# Patient Record
Sex: Male | Born: 1935 | Race: White | Hispanic: No | Smoking: Former smoker
Health system: Southern US, Community
[De-identification: ages and names within clinical notes are randomized; demographics above are authoritative.]

## PROBLEM LIST (undated history)

## (undated) DIAGNOSIS — E119 Type 2 diabetes mellitus without complications: Secondary | ICD-10-CM

## (undated) DIAGNOSIS — E785 Hyperlipidemia, unspecified: Secondary | ICD-10-CM

## (undated) DIAGNOSIS — J449 Chronic obstructive pulmonary disease, unspecified: Secondary | ICD-10-CM

## (undated) DIAGNOSIS — I10 Essential (primary) hypertension: Secondary | ICD-10-CM

## (undated) DIAGNOSIS — I2510 Atherosclerotic heart disease of native coronary artery without angina pectoris: Secondary | ICD-10-CM

## (undated) HISTORY — PX: JOINT REPLACEMENT: SHX530

## (undated) HISTORY — PX: HERNIA REPAIR: SHX51

---

## 2015-04-23 DIAGNOSIS — N182 Chronic kidney disease, stage 2 (mild): Secondary | ICD-10-CM | POA: Diagnosis present

## 2015-04-23 DIAGNOSIS — I10 Essential (primary) hypertension: Secondary | ICD-10-CM | POA: Diagnosis present

## 2015-04-23 DIAGNOSIS — E119 Type 2 diabetes mellitus without complications: Secondary | ICD-10-CM

## 2015-04-23 DIAGNOSIS — I2510 Atherosclerotic heart disease of native coronary artery without angina pectoris: Secondary | ICD-10-CM | POA: Diagnosis present

## 2015-04-25 DIAGNOSIS — F0280 Dementia in other diseases classified elsewhere without behavioral disturbance: Secondary | ICD-10-CM | POA: Diagnosis present

## 2017-11-05 ENCOUNTER — Encounter (HOSPITAL_COMMUNITY): Payer: Self-pay | Admitting: Emergency Medicine

## 2017-11-05 ENCOUNTER — Emergency Department (HOSPITAL_COMMUNITY): Payer: Medicare Other

## 2017-11-05 ENCOUNTER — Other Ambulatory Visit: Payer: Self-pay

## 2017-11-05 ENCOUNTER — Inpatient Hospital Stay (HOSPITAL_COMMUNITY): Payer: Medicare Other

## 2017-11-05 DIAGNOSIS — N400 Enlarged prostate without lower urinary tract symptoms: Secondary | ICD-10-CM

## 2017-11-05 DIAGNOSIS — E119 Type 2 diabetes mellitus without complications: Secondary | ICD-10-CM

## 2017-11-05 DIAGNOSIS — R0602 Shortness of breath: Secondary | ICD-10-CM

## 2017-11-05 DIAGNOSIS — F0280 Dementia in other diseases classified elsewhere without behavioral disturbance: Secondary | ICD-10-CM | POA: Diagnosis present

## 2017-11-05 DIAGNOSIS — I340 Nonrheumatic mitral (valve) insufficiency: Secondary | ICD-10-CM | POA: Diagnosis not present

## 2017-11-05 DIAGNOSIS — E1142 Type 2 diabetes mellitus with diabetic polyneuropathy: Secondary | ICD-10-CM | POA: Diagnosis present

## 2017-11-05 DIAGNOSIS — I272 Pulmonary hypertension, unspecified: Secondary | ICD-10-CM | POA: Diagnosis not present

## 2017-11-05 DIAGNOSIS — F419 Anxiety disorder, unspecified: Secondary | ICD-10-CM | POA: Diagnosis present

## 2017-11-05 DIAGNOSIS — N189 Chronic kidney disease, unspecified: Secondary | ICD-10-CM

## 2017-11-05 DIAGNOSIS — Z794 Long term (current) use of insulin: Secondary | ICD-10-CM

## 2017-11-05 DIAGNOSIS — I214 Non-ST elevation (NSTEMI) myocardial infarction: Secondary | ICD-10-CM | POA: Diagnosis present

## 2017-11-05 DIAGNOSIS — Z66 Do not resuscitate: Secondary | ICD-10-CM

## 2017-11-05 DIAGNOSIS — K219 Gastro-esophageal reflux disease without esophagitis: Secondary | ICD-10-CM | POA: Diagnosis present

## 2017-11-05 DIAGNOSIS — R0682 Tachypnea, not elsewhere classified: Secondary | ICD-10-CM | POA: Diagnosis not present

## 2017-11-05 DIAGNOSIS — Z4659 Encounter for fitting and adjustment of other gastrointestinal appliance and device: Secondary | ICD-10-CM

## 2017-11-05 DIAGNOSIS — K567 Ileus, unspecified: Secondary | ICD-10-CM | POA: Diagnosis not present

## 2017-11-05 DIAGNOSIS — N179 Acute kidney failure, unspecified: Secondary | ICD-10-CM | POA: Diagnosis present

## 2017-11-05 DIAGNOSIS — Z87891 Personal history of nicotine dependence: Secondary | ICD-10-CM | POA: Diagnosis not present

## 2017-11-05 DIAGNOSIS — N182 Chronic kidney disease, stage 2 (mild): Secondary | ICD-10-CM | POA: Diagnosis present

## 2017-11-05 DIAGNOSIS — I5023 Acute on chronic systolic (congestive) heart failure: Secondary | ICD-10-CM

## 2017-11-05 DIAGNOSIS — E1151 Type 2 diabetes mellitus with diabetic peripheral angiopathy without gangrene: Secondary | ICD-10-CM | POA: Diagnosis present

## 2017-11-05 DIAGNOSIS — Z0189 Encounter for other specified special examinations: Secondary | ICD-10-CM

## 2017-11-05 DIAGNOSIS — Z8249 Family history of ischemic heart disease and other diseases of the circulatory system: Secondary | ICD-10-CM

## 2017-11-05 DIAGNOSIS — Z79899 Other long term (current) drug therapy: Secondary | ICD-10-CM

## 2017-11-05 DIAGNOSIS — R0902 Hypoxemia: Secondary | ICD-10-CM

## 2017-11-05 DIAGNOSIS — F329 Major depressive disorder, single episode, unspecified: Secondary | ICD-10-CM | POA: Diagnosis present

## 2017-11-05 DIAGNOSIS — R400 Somnolence: Secondary | ICD-10-CM | POA: Diagnosis not present

## 2017-11-05 DIAGNOSIS — N183 Chronic kidney disease, stage 3 (moderate): Secondary | ICD-10-CM | POA: Diagnosis not present

## 2017-11-05 DIAGNOSIS — E1122 Type 2 diabetes mellitus with diabetic chronic kidney disease: Secondary | ICD-10-CM | POA: Diagnosis present

## 2017-11-05 DIAGNOSIS — I255 Ischemic cardiomyopathy: Secondary | ICD-10-CM | POA: Diagnosis present

## 2017-11-05 DIAGNOSIS — J441 Chronic obstructive pulmonary disease with (acute) exacerbation: Secondary | ICD-10-CM | POA: Diagnosis present

## 2017-11-05 DIAGNOSIS — N184 Chronic kidney disease, stage 4 (severe): Secondary | ICD-10-CM | POA: Diagnosis present

## 2017-11-05 DIAGNOSIS — I4892 Unspecified atrial flutter: Secondary | ICD-10-CM | POA: Diagnosis not present

## 2017-11-05 DIAGNOSIS — E785 Hyperlipidemia, unspecified: Secondary | ICD-10-CM | POA: Diagnosis present

## 2017-11-05 DIAGNOSIS — I130 Hypertensive heart and chronic kidney disease with heart failure and stage 1 through stage 4 chronic kidney disease, or unspecified chronic kidney disease: Secondary | ICD-10-CM | POA: Diagnosis present

## 2017-11-05 DIAGNOSIS — I2510 Atherosclerotic heart disease of native coronary artery without angina pectoris: Secondary | ICD-10-CM | POA: Diagnosis present

## 2017-11-05 DIAGNOSIS — I5043 Acute on chronic combined systolic (congestive) and diastolic (congestive) heart failure: Secondary | ICD-10-CM | POA: Diagnosis present

## 2017-11-05 DIAGNOSIS — J810 Acute pulmonary edema: Secondary | ICD-10-CM | POA: Insufficient documentation

## 2017-11-05 DIAGNOSIS — F1010 Alcohol abuse, uncomplicated: Secondary | ICD-10-CM | POA: Diagnosis present

## 2017-11-05 DIAGNOSIS — Z6831 Body mass index (BMI) 31.0-31.9, adult: Secondary | ICD-10-CM

## 2017-11-05 DIAGNOSIS — J9601 Acute respiratory failure with hypoxia: Secondary | ICD-10-CM | POA: Diagnosis present

## 2017-11-05 DIAGNOSIS — Z515 Encounter for palliative care: Secondary | ICD-10-CM

## 2017-11-05 DIAGNOSIS — R0789 Other chest pain: Secondary | ICD-10-CM | POA: Diagnosis present

## 2017-11-05 DIAGNOSIS — K56609 Unspecified intestinal obstruction, unspecified as to partial versus complete obstruction: Secondary | ICD-10-CM | POA: Diagnosis not present

## 2017-11-05 DIAGNOSIS — I361 Nonrheumatic tricuspid (valve) insufficiency: Secondary | ICD-10-CM | POA: Diagnosis not present

## 2017-11-05 DIAGNOSIS — E669 Obesity, unspecified: Secondary | ICD-10-CM | POA: Diagnosis present

## 2017-11-05 DIAGNOSIS — I420 Dilated cardiomyopathy: Secondary | ICD-10-CM | POA: Diagnosis not present

## 2017-11-05 DIAGNOSIS — I959 Hypotension, unspecified: Secondary | ICD-10-CM | POA: Diagnosis present

## 2017-11-05 DIAGNOSIS — E7849 Other hyperlipidemia: Secondary | ICD-10-CM | POA: Diagnosis not present

## 2017-11-05 DIAGNOSIS — R079 Chest pain, unspecified: Secondary | ICD-10-CM | POA: Diagnosis not present

## 2017-11-05 DIAGNOSIS — I10 Essential (primary) hypertension: Secondary | ICD-10-CM | POA: Diagnosis not present

## 2017-11-05 DIAGNOSIS — F32A Depression, unspecified: Secondary | ICD-10-CM

## 2017-11-05 DIAGNOSIS — F320 Major depressive disorder, single episode, mild: Secondary | ICD-10-CM | POA: Diagnosis not present

## 2017-11-05 HISTORY — DX: Atherosclerotic heart disease of native coronary artery without angina pectoris: I25.10

## 2017-11-05 HISTORY — DX: Hyperlipidemia, unspecified: E78.5

## 2017-11-05 HISTORY — DX: Type 2 diabetes mellitus without complications: E11.9

## 2017-11-05 HISTORY — DX: Essential (primary) hypertension: I10

## 2017-11-05 HISTORY — DX: Chronic obstructive pulmonary disease, unspecified: J44.9

## 2017-11-05 LAB — BASIC METABOLIC PANEL
ANION GAP: 11 (ref 5–15)
BUN: 32 mg/dL — AB (ref 6–20)
CALCIUM: 7.9 mg/dL — AB (ref 8.9–10.3)
CO2: 21 mmol/L — ABNORMAL LOW (ref 22–32)
Chloride: 105 mmol/L (ref 101–111)
Creatinine, Ser: 1.83 mg/dL — ABNORMAL HIGH (ref 0.61–1.24)
GFR calc Af Amer: 39 mL/min — ABNORMAL LOW (ref >60)
GFR, EST NON AFRICAN AMERICAN: 33 mL/min — AB (ref >60)
Glucose, Bld: 291 mg/dL — ABNORMAL HIGH (ref 65–99)
POTASSIUM: 3.8 mmol/L (ref 3.5–5.1)
SODIUM: 137 mmol/L (ref 135–145)

## 2017-11-05 LAB — PROTIME-INR
INR: 0.98
Prothrombin Time: 12.8 seconds (ref 11.4–15.2)

## 2017-11-05 LAB — TSH: TSH: 1.841 u[IU]/mL (ref 0.350–4.500)

## 2017-11-05 LAB — CBG MONITORING, ED
GLUCOSE-CAPILLARY: 188 mg/dL — AB (ref 65–99)
Glucose-Capillary: 234 mg/dL — ABNORMAL HIGH (ref 65–99)

## 2017-11-05 LAB — TROPONIN I
TROPONIN I: 0.39 ng/mL — AB (ref <0.03)
TROPONIN I: 0.69 ng/mL — AB (ref <0.03)
Troponin I: 0.47 ng/mL (ref <0.03)

## 2017-11-05 LAB — I-STAT TROPONIN, ED
TROPONIN I, POC: 0.09 ng/mL — AB (ref 0.00–0.08)
Troponin i, poc: 0.17 ng/mL (ref 0.00–0.08)

## 2017-11-05 LAB — CBC
HEMATOCRIT: 36.4 % — AB (ref 39.0–52.0)
Hemoglobin: 12.2 g/dL — ABNORMAL LOW (ref 13.0–17.0)
MCH: 30.5 pg (ref 26.0–34.0)
MCHC: 33.5 g/dL (ref 30.0–36.0)
MCV: 91 fL (ref 78.0–100.0)
PLATELETS: 144 10*3/uL — AB (ref 150–400)
RBC: 4 MIL/uL — ABNORMAL LOW (ref 4.22–5.81)
RDW: 14.8 % (ref 11.5–15.5)
WBC: 9.4 10*3/uL (ref 4.0–10.5)

## 2017-11-05 LAB — HEPARIN LEVEL (UNFRACTIONATED)
Heparin Unfractionated: <0.1 IU/mL — ABNORMAL LOW (ref 0.30–0.70)
Heparin Unfractionated: <0.1 IU/mL — ABNORMAL LOW (ref 0.30–0.70)

## 2017-11-05 LAB — RAPID URINE DRUG SCREEN, HOSP PERFORMED
AMPHETAMINES: NOT DETECTED
BARBITURATES: NOT DETECTED
BENZODIAZEPINES: NOT DETECTED
COCAINE: NOT DETECTED
Opiates: POSITIVE — AB
TETRAHYDROCANNABINOL: NOT DETECTED

## 2017-11-05 LAB — MRSA PCR SCREENING: MRSA by PCR: NEGATIVE

## 2017-11-05 LAB — GLUCOSE, CAPILLARY
GLUCOSE-CAPILLARY: 159 mg/dL — AB (ref 65–99)
GLUCOSE-CAPILLARY: 168 mg/dL — AB (ref 65–99)

## 2017-11-05 LAB — HEMOGLOBIN A1C
Hgb A1c MFr Bld: 6.8 % — ABNORMAL HIGH (ref 4.8–5.6)
MEAN PLASMA GLUCOSE: 148.46 mg/dL

## 2017-11-05 LAB — BRAIN NATRIURETIC PEPTIDE: B Natriuretic Peptide: 410.1 pg/mL — ABNORMAL HIGH (ref 0.0–100.0)

## 2017-11-05 MED ORDER — HEPARIN (PORCINE) IN NACL 100-0.45 UNIT/ML-% IJ SOLN
1700.0000 [IU]/h | INTRAMUSCULAR | Status: DC
  Administered 2017-11-05: 1200 [IU]/h via INTRAVENOUS
  Administered 2017-11-05: 1500 [IU]/h via INTRAVENOUS
  Filled 2017-11-05 (×2): qty 250

## 2017-11-05 MED ORDER — CARVEDILOL 3.125 MG PO TABS
3.1250 mg | ORAL_TABLET | Freq: Two times a day (BID) | ORAL | Status: DC
  Administered 2017-11-05 – 2017-11-08 (×7): 3.125 mg via ORAL
  Filled 2017-11-05 (×9): qty 1

## 2017-11-05 MED ORDER — FUROSEMIDE 10 MG/ML IJ SOLN
40.0000 mg | INTRAMUSCULAR | Status: AC
  Administered 2017-11-05: 40 mg via INTRAVENOUS
  Filled 2017-11-05: qty 4

## 2017-11-05 MED ORDER — VITAMIN D (ERGOCALCIFEROL) 1.25 MG (50000 UNIT) PO CAPS
50000.0000 [IU] | ORAL_CAPSULE | ORAL | Status: DC
  Administered 2017-11-08: 50000 [IU] via ORAL
  Filled 2017-11-05: qty 1

## 2017-11-05 MED ORDER — FUROSEMIDE 10 MG/ML IJ SOLN
40.0000 mg | Freq: Two times a day (BID) | INTRAMUSCULAR | Status: DC
  Administered 2017-11-05 – 2017-11-06 (×2): 40 mg via INTRAVENOUS
  Filled 2017-11-05 (×2): qty 4

## 2017-11-05 MED ORDER — HEPARIN BOLUS VIA INFUSION
3000.0000 [IU] | Freq: Once | INTRAVENOUS | Status: AC
  Administered 2017-11-05: 3000 [IU] via INTRAVENOUS
  Filled 2017-11-05: qty 3000

## 2017-11-05 MED ORDER — ALBUTEROL SULFATE (2.5 MG/3ML) 0.083% IN NEBU
2.5000 mg | INHALATION_SOLUTION | RESPIRATORY_TRACT | Status: DC | PRN
  Administered 2017-11-06 – 2017-11-09 (×7): 2.5 mg via RESPIRATORY_TRACT
  Filled 2017-11-05 (×9): qty 3

## 2017-11-05 MED ORDER — PANTOPRAZOLE SODIUM 40 MG PO TBEC
40.0000 mg | DELAYED_RELEASE_TABLET | Freq: Every day | ORAL | Status: DC
  Administered 2017-11-05 – 2017-11-08 (×4): 40 mg via ORAL
  Filled 2017-11-05 (×4): qty 1

## 2017-11-05 MED ORDER — LISINOPRIL 2.5 MG PO TABS: 2.5000 mg | ORAL_TABLET | Freq: Every day | ORAL | Status: DC

## 2017-11-05 MED ORDER — ATORVASTATIN CALCIUM 40 MG PO TABS
40.0000 mg | ORAL_TABLET | Freq: Every day | ORAL | Status: DC
  Administered 2017-11-05 – 2017-11-06 (×2): 40 mg via ORAL
  Filled 2017-11-05 (×3): qty 1

## 2017-11-05 MED ORDER — ALPRAZOLAM 0.25 MG PO TABS
0.2500 mg | ORAL_TABLET | Freq: Two times a day (BID) | ORAL | Status: DC | PRN
  Administered 2017-11-05: 0.25 mg via ORAL
  Filled 2017-11-05 (×2): qty 1

## 2017-11-05 MED ORDER — ONDANSETRON HCL 4 MG/2ML IJ SOLN: 4.0000 mg | Freq: Four times a day (QID) | INTRAMUSCULAR | Status: DC | PRN

## 2017-11-05 MED ORDER — ACETAMINOPHEN 325 MG PO TABS: 650.0000 mg | ORAL_TABLET | ORAL | Status: DC | PRN

## 2017-11-05 MED ORDER — VITAMIN B-1 100 MG PO TABS
100.0000 mg | ORAL_TABLET | Freq: Every day | ORAL | Status: DC
  Administered 2017-11-05 – 2017-11-08 (×4): 100 mg via ORAL
  Filled 2017-11-05 (×4): qty 1

## 2017-11-05 MED ORDER — TERAZOSIN HCL 2 MG PO CAPS
2.0000 mg | ORAL_CAPSULE | Freq: Every day | ORAL | Status: DC
  Administered 2017-11-05 – 2017-11-08 (×4): 2 mg via ORAL
  Filled 2017-11-05 (×5): qty 1

## 2017-11-05 MED ORDER — ADULT MULTIVITAMIN W/MINERALS CH
1.0000 | ORAL_TABLET | Freq: Every day | ORAL | Status: DC
  Administered 2017-11-05 – 2017-11-08 (×4): 1 via ORAL
  Filled 2017-11-05 (×4): qty 1

## 2017-11-05 MED ORDER — ASPIRIN 81 MG PO CHEW
81.0000 mg | CHEWABLE_TABLET | Freq: Every day | ORAL | Status: DC
  Administered 2017-11-05 – 2017-11-06 (×2): 81 mg via ORAL
  Filled 2017-11-05 (×2): qty 1

## 2017-11-05 MED ORDER — LORAZEPAM 2 MG/ML IJ SOLN: 1.0000 mg | Freq: Four times a day (QID) | INTRAMUSCULAR | Status: DC | PRN

## 2017-11-05 MED ORDER — INSULIN DETEMIR 100 UNIT/ML ~~LOC~~ SOLN
6.0000 [IU] | Freq: Every day | SUBCUTANEOUS | Status: DC
  Filled 2017-11-05: qty 0.06

## 2017-11-05 MED ORDER — FLUOXETINE HCL 20 MG PO CAPS
40.0000 mg | ORAL_CAPSULE | Freq: Every day | ORAL | Status: DC
  Administered 2017-11-05 – 2017-11-08 (×4): 40 mg via ORAL
  Filled 2017-11-05 (×4): qty 2

## 2017-11-05 MED ORDER — GABAPENTIN 300 MG PO CAPS
300.0000 mg | ORAL_CAPSULE | Freq: Two times a day (BID) | ORAL | Status: DC
  Administered 2017-11-05 – 2017-11-08 (×8): 300 mg via ORAL
  Filled 2017-11-05 (×8): qty 1

## 2017-11-05 MED ORDER — MELOXICAM 7.5 MG PO TABS: 7.5000 mg | ORAL_TABLET | Freq: Every day | ORAL | Status: DC

## 2017-11-05 MED ORDER — THIAMINE HCL 100 MG/ML IJ SOLN: 100.0000 mg | Freq: Every day | INTRAMUSCULAR | Status: DC

## 2017-11-05 MED ORDER — MORPHINE SULFATE (PF) 4 MG/ML IV SOLN
2.0000 mg | INTRAVENOUS | Status: DC | PRN
  Administered 2017-11-05 (×3): 2 mg via INTRAVENOUS
  Filled 2017-11-05 (×3): qty 1

## 2017-11-05 MED ORDER — INSULIN ASPART 100 UNIT/ML ~~LOC~~ SOLN
0.0000 [IU] | Freq: Three times a day (TID) | SUBCUTANEOUS | Status: DC
  Administered 2017-11-05: 3 [IU] via SUBCUTANEOUS
  Administered 2017-11-05 (×2): 2 [IU] via SUBCUTANEOUS
  Administered 2017-11-06 (×2): 3 [IU] via SUBCUTANEOUS
  Administered 2017-11-06 – 2017-11-07 (×2): 2 [IU] via SUBCUTANEOUS
  Administered 2017-11-07: 3 [IU] via SUBCUTANEOUS
  Administered 2017-11-07: 5 [IU] via SUBCUTANEOUS
  Filled 2017-11-05: qty 1

## 2017-11-05 MED ORDER — IPRATROPIUM-ALBUTEROL 0.5-2.5 (3) MG/3ML IN SOLN
3.0000 mL | RESPIRATORY_TRACT | Status: DC
  Administered 2017-11-05 (×3): 3 mL via RESPIRATORY_TRACT
  Filled 2017-11-05 (×3): qty 3

## 2017-11-05 MED ORDER — HEPARIN BOLUS VIA INFUSION
4000.0000 [IU] | Freq: Once | INTRAVENOUS | Status: AC
  Administered 2017-11-05: 4000 [IU] via INTRAVENOUS
  Filled 2017-11-05: qty 4000

## 2017-11-05 MED ORDER — IOPAMIDOL (ISOVUE-370) INJECTION 76%
INTRAVENOUS | Status: AC
  Administered 2017-11-05: 80 mL
  Filled 2017-11-05: qty 100

## 2017-11-05 MED ORDER — FUROSEMIDE 10 MG/ML IJ SOLN: 40.0000 mg | Freq: Two times a day (BID) | INTRAMUSCULAR | Status: DC

## 2017-11-05 MED ORDER — INSULIN DETEMIR 100 UNIT/ML ~~LOC~~ SOLN
12.0000 [IU] | Freq: Every day | SUBCUTANEOUS | Status: DC
  Administered 2017-11-05 – 2017-11-06 (×2): 12 [IU] via SUBCUTANEOUS
  Filled 2017-11-05 (×2): qty 0.12

## 2017-11-05 MED ORDER — FOLIC ACID 1 MG PO TABS
1.0000 mg | ORAL_TABLET | Freq: Every day | ORAL | Status: DC
  Administered 2017-11-05 – 2017-11-08 (×4): 1 mg via ORAL
  Filled 2017-11-05 (×4): qty 1

## 2017-11-05 MED ORDER — FINASTERIDE 5 MG PO TABS
5.0000 mg | ORAL_TABLET | Freq: Every day | ORAL | Status: DC
  Administered 2017-11-05 – 2017-11-08 (×4): 5 mg via ORAL
  Filled 2017-11-05 (×4): qty 1

## 2017-11-05 MED ORDER — LORAZEPAM 1 MG PO TABS: 1.0000 mg | ORAL_TABLET | Freq: Four times a day (QID) | ORAL | Status: DC | PRN

## 2017-11-05 MED ORDER — ISOSORBIDE DINITRATE 10 MG PO TABS
10.0000 mg | ORAL_TABLET | Freq: Every day | ORAL | Status: DC
  Administered 2017-11-05: 10 mg via ORAL
  Filled 2017-11-05: qty 1

## 2017-11-05 MED ORDER — ATORVASTATIN CALCIUM 20 MG PO TABS: 20.0000 mg | ORAL_TABLET | Freq: Every day | ORAL | Status: DC

## 2017-11-05 MED ORDER — RANOLAZINE ER 500 MG PO TB12
500.0000 mg | ORAL_TABLET | Freq: Every evening | ORAL | Status: DC
  Administered 2017-11-05 – 2017-11-08 (×3): 500 mg via ORAL
  Filled 2017-11-05 (×5): qty 1

## 2017-11-05 MED ORDER — BUPROPION HCL ER (XL) 300 MG PO TB24
300.0000 mg | ORAL_TABLET | Freq: Every day | ORAL | Status: DC
  Administered 2017-11-05 – 2017-11-08 (×4): 300 mg via ORAL
  Filled 2017-11-05: qty 1
  Filled 2017-11-05: qty 2
  Filled 2017-11-05 (×4): qty 1

## 2017-11-05 MED ORDER — TAMSULOSIN HCL 0.4 MG PO CAPS
0.4000 mg | ORAL_CAPSULE | Freq: Every day | ORAL | Status: DC
  Administered 2017-11-05 – 2017-11-08 (×4): 0.4 mg via ORAL
  Filled 2017-11-05 (×4): qty 1

## 2017-11-05 MED ORDER — NITROGLYCERIN IN D5W 200-5 MCG/ML-% IV SOLN
0.0000 ug/min | Freq: Once | INTRAVENOUS | Status: AC
  Administered 2017-11-05: 5 ug/min via INTRAVENOUS
  Filled 2017-11-05: qty 250

## 2017-11-05 MED ORDER — GI COCKTAIL ~~LOC~~
30.0000 mL | Freq: Four times a day (QID) | ORAL | Status: DC | PRN
  Administered 2017-11-05 – 2017-11-07 (×2): 30 mL via ORAL
  Filled 2017-11-05 (×2): qty 30

## 2017-11-05 NOTE — ED Notes (Signed)
Lillia AbedLindsay, cards NP, at bedside

## 2017-11-05 NOTE — Consult Note (Signed)
Cardiology Consult    Patient ID: Mark Flynn MRN: [id number], DOB/AGE: 1936/07/28   Admit date: 11/24/2017 Date of Consult: 11/24/2017  Primary Physician: Anselmo Pickler, MD Primary Cardiologist: Dulce Sellar Requesting Provider: Ophelia Charter Reason for Consultation: Chest pain  Mark Flynn is a 82 y.o. male who is being seen today for the evaluation of chest pain at the request of Dr. Ophelia Charter.  Patient Profile    82 yo male with PMH of CAD ( CTO of RCA with collaterals, diffuse disease), ICM, HTN, HL, and DM who presented with chest pain and shortness of breath.   Past Medical History   Past Medical History:  Diagnosis Date  . COPD (chronic obstructive pulmonary disease) (HCC)   . Coronary artery disease   . Diabetes mellitus without complication (HCC)   . Hyperlipidemia   . Hypertension     Past Surgical History:  Procedure Laterality Date  . HERNIA REPAIR    . JOINT REPLACEMENT       Allergies  Allergies  Allergen Reactions  . Ambien [Zolpidem Tartrate] Other (See Comments)    Drives me crazy  . Codeine Other (See Comments)    unknown    History of Present Illness    Mark Flynn is a 82 yo male with PMH of CAD ( CTO of RCA with collaterals, diffuse disease), ICM, HTN, HL, and DM. He underwent cardiac cath back in 2016 that showed a CTO of the RCA with collaterals, along with 35% LM, 50% pLAD. Intervention at that time as RCA was not felt to be amenable to PCI. Echo showed EF of 30-35% with global hypokinesis. He was seen by Dr. Dulce Sellar in 9/16 but appears to have been lost to follow up. He currently lives at home alone, but family checks in on him at times. States he has been having episodes of chest pain on a frequent basis over the past several months. Mostly with exertion. Usually improves when he sits down to rest and takes SL nitro.   Yesterday around 4:30pm he reports a sudden onset of chest pain with radiation to the left shoulder and back, along with significant dyspnea.  He sat down and tried to rest, took nitro but symptoms persisted. Eventually called his grandson who called EMS.   On arrival to the ED labs showed stable electrolytes, Cr 1.8, Hgb 12.2, Trop 0.09>>0.17>>0.39, Hgb A1c 6.8, TSH 1.841, BNP 410.1. EKG showed SR with LBBB (old). CXR with pulmonary edema. He was started on IV lasix with some UOP noted thus far.   Inpatient Medications    . aspirin  81 mg Oral Daily  . atorvastatin  40 mg Oral q1800  . buPROPion  300 mg Oral Daily  . carvedilol  3.125 mg Oral BID WC  . finasteride  5 mg Oral Daily  . FLUoxetine  40 mg Oral Daily  . folic acid  1 mg Oral Daily  . furosemide  40 mg Intravenous BID  . gabapentin  300 mg Oral BID  . insulin aspart  0-9 Units Subcutaneous TID WC  . insulin detemir  12 Units Subcutaneous Daily  . ipratropium-albuterol  3 mL Nebulization Q4H  . multivitamin with minerals  1 tablet Oral Daily  . pantoprazole  40 mg Oral Daily  . ranolazine  500 mg Oral QPM  . tamsulosin  0.4 mg Oral Daily  . terazosin  2 mg Oral QHS  . thiamine  100 mg Oral Daily   Or  . thiamine  100 mg  Intravenous Daily  . [START ON 11/08/2017] Vitamin D (Ergocalciferol)  50,000 Units Oral Q7 days    Family History    Family History  Problem Relation Age of Onset  . Hypertension Father     Social History    Social History   Socioeconomic History  . Marital status: Widowed    Spouse name: Not on file  . Number of children: Not on file  . Years of education: Not on file  . Highest education level: Not on file  Social Needs  . Financial resource strain: Not on file  . Food insecurity - worry: Not on file  . Food insecurity - inability: Not on file  . Transportation needs - medical: Not on file  . Transportation needs - non-medical: Not on file  Occupational History  . Not on file  Tobacco Use  . Smoking status: Former Smoker  Substance and Sexual Activity  . Alcohol use: No    Frequency: Never  . Drug use: No  . Sexual  activity: Not on file  Other Topics Concern  . Not on file  Social History Narrative  . Not on file     Review of Systems    See HPI  All other systems reviewed and are otherwise negative except as noted above.  Physical Exam    Blood pressure 116/80, pulse 87, temperature (!) 97.5 F (36.4 C), temperature source Oral, resp. rate (!) 24, height 6\' 1"  (1.854 m), weight 225 lb (102.1 kg), SpO2 95 %.  General: Ill appearing older male, dyspneic and diaphoretic  Psych: Normal affect. Neuro: Alert and oriented X 3. Moves all extremities spontaneously. HEENT: Normal  Neck: Supple without bruits, + JVD. Lungs:  Resp regular, but mildly labored, Diminished bilaterally. Heart: RRR no s3, s4, or murmurs. Abdomen: Soft, non-tender, non-distended, BS + x 4.  Extremities: No clubbing, cyanosis or edema. DP/PT/Radials 2+ and equal bilaterally.  Labs    Troponin [facility](Point of Care Test) Recent Labs    10/30/2017 0641  TROPIPOC 0.17*   Recent Labs    11/24/2017 1014  TROPONINI 0.39*   Lab Results  Component Value Date   WBC 9.4 11/28/2017   HGB 12.2 (L) 11/21/2017   HCT 36.4 (L) 11/19/2017   MCV 91.0 11/03/2017   PLT 144 (L) 11/29/2017    Recent Labs  Lab 11/27/2017 0223  NA 137  K 3.8  CL 105  CO2 21*  BUN 32*  CREATININE 1.83*  CALCIUM 7.9*  GLUCOSE 291*   No results found for: CHOL, HDL, LDLCALC, TRIG No results found for: [facility]   Radiology Studies    Ct Angio Chest Pe W And/or Wo Contrast  Result Date: 11/02/2017 CLINICAL DATA:  Chest pain.  PE suspected, high pretest prob EXAM: CT ANGIOGRAPHY CHEST WITH CONTRAST TECHNIQUE: Multidetector CT imaging of the chest was performed using the standard protocol during bolus administration of intravenous contrast. Multiplanar CT image reconstructions and MIPs were obtained to evaluate the vascular anatomy. CONTRAST:  65mL ISOVUE-370 IOPAMIDOL (ISOVUE-370) INJECTION 76% COMPARISON:  Chest radiograph earlier this day.  No prior chest  CT. FINDINGS: Cardiovascular: There are no filling defects within the pulmonary arteries to suggest pulmonary embolus. Multi chamber cardiomegaly. Thoracic aorta is normal in caliber with minimal atherosclerotic calcifications. There are coronary artery calcifications. No pericardial effusion. Mediastinum/Nodes: Shotty mediastinal and bilateral hilar nodes. The esophagus is decompressed. No thyroid nodule. Lungs/Pleura: Diffuse bilateral ground-glass opacity, favored to be pulmonary edema. Small layering pleural effusions with fluid in  the fissures. Smooth septal thickening. Trachea and mainstem bronchi are patent. Punctate calcified granuloma in the right lower lobe. Upper Abdomen: No acute finding. Musculoskeletal: Right shoulder arthroplasty. There are no acute or suspicious osseous abnormalities. Review of the MIP images confirms the above findings. IMPRESSION: 1. No pulmonary embolus. 2. Constellation of findings consistent with CHF with cardiomegaly, pleural effusions and septal thickening. Diffuse bilateral ground-glass opacities favored to be alveolar edema, less likely considerations include pneumonia or pulmonary hemorrhage. 3. Coronary artery calcifications.  Mild aortic atherosclerosis. Electronically Signed   By: Rubye Oaks M.D.   On: 11/29/2017 04:43   Dg Chest Port 1 View  Result Date: Nov 29, 2017 CLINICAL DATA:  Chest pain.  Shortness of breath. EXAM: PORTABLE CHEST 1 VIEW COMPARISON:  08/14/2016 FINDINGS: The heart is enlarged, slight progression from prior exam. Mild pulmonary edema. No evidence of pleural effusion. Linear atelectasis or scarring in the right midlung. No confluent airspace disease. No pneumothorax. Right shoulder arthroplasty. IMPRESSION: Cardiomegaly and pulmonary edema. Electronically Signed   By: Rubye Oaks M.D.   On: 2017-11-29 03:17    ECG & Cardiac Imaging    EKG: SR with known LBBB  Echo: 6/16  Findings Mitral Valve Structurally normal mitral valve  with good mobility. Mild mitral regurgitation by color flow doppler examinations. Aortic Valve There is mild aortic sclerosis noted, with no evidence of stenosis. No aortic regurgitation by color flow doppler examinations. Tricuspid Valve Tricuspid valve is structurally normal. Mild tricuspid regurgitation by color flow doppler examinations. RVSP 39 mm Hg. Pulmonic Valve The pulmonic valve was not well visualized No Doppler evidence of pulmonic stenosis or insufficiency. Left Atrium Moderately dilated left atrium. Left Ventricle Left ventricle cavity size is normal. Moderate global LV hypokinesis. Ejection fraction is visually estimated at 30% Right Atrium Not well visualized Right Ventricle Not well visualized Doppler Measurements & Calculations  MV Peak E-Wave: 90 cm/s     AV Peak Velocity: 127 cm/s  MV Peak A-Wave: 119 cm/s    AV Peak Gradient: 6.45 mmHg  MV E/A Ratio: 0.76  MV Peak Gradient: 3.24 mmHg  Cath: 6/16  LMCA: Abnormal.  - Lesion on LMCA: Mid subsection.35% stenosis 10 mm length . Good run off was present. LAD: Single stenosis.  - Lesion on Prox LAD: Distal subsection.50% stenosis 10 mm length . Pre procedure TIMI III flow was noted. Good run off was present. LCx: Normal. RCA: Chronic occlusion.  - Lesion on Dist RCA: Mid subsection.100% stenosis 15 mm length . Pre procedure TIMI 0 flow was noted. Poor run off was present. Cardiac Collaterals   - Moderatecollateral flow from the Dist LAD to the Dist RCA.  Assessment & Plan    82 yo male with PMH of CAD (CTO of RCA with collaterals, diffuse disease), ICM, HTN, HL, and DM who presented with chest pain and shortness of breath.   1. Chest pain: Presented with sudden onset of chest pain and shortness of breath around 430pm yesterday. Trop 0.09>>0.17>>0.39. Still having 3/10 shoulder/upper back pain. EKG showed SR with LBBB, which is old but QSR now wider in comparison to previous read out in care everywhere.  --  start IV nitro -- continue IV heparin -- cycle troponins -- check stat echo -- plan for cardiac cath in the am pending renal function and respiratory status. The patient understands that risks included but are not limited to stroke (1 in 1000), death (1 in 1000), kidney failure [usually temporary] (1 in 500), bleeding (1 in 200), allergic  reaction [possibly serious] (1 in 200). Low threshold to take for cath sooner if becomes more symptomatic.  2. ICM: Known EF of 30% with global hypokinesis back in 2016. BNP 400 and CXR with edema. He has been given 40mg  IV lasix with some UOP thus far.  -- continue diuresis -- on BB, no room for ACE/ARB given Cr -- check echo  3. HTN: Stable with current therapy  4. HL: was not on home statin.  -- lipitor 40mg   -- check lipids  5. IDDM: CBGs uncontrolled. Hgb A1c 6.8.  -- SSI while inpatient    Signed, Laverda Page, NP-C Pager [phone] 11/18/2017, 1:19 PM   Patient seen, examined. Available data reviewed. Agree with findings, assessment, and plan as outlined by Laverda Page, NP.  The patient is independently interviewed and examined.  His grandchildren are at the bedside.  On my exam, he is an elderly male in no distress.  JVP is moderately elevated, lungs have bilateral rales at the bases, heart is regular rate and rhythm with no murmur or gallop.  Carotid upstrokes are normal without bruits.  Abdomen is soft and nontender.  Extremities show no pretibial edema.  All available data is reviewed.  The patient has typical symptoms of acute coronary syndrome with crescendo angina, elevated troponin, and left bundle branch block on EKG.  He has previously documented intraventricular conduction delay based on reported EKG findings from another health system.  He now has a wide left bundle branch block with QRS duration approximately 180 ms.  He has findings consistent with acute heart failure as well.  Fortunately he notes early improvement with IV  diuresis.  He states his abdomen is less swollen.  He feels like his chest pain is improving and currently is minimal.  I agree with the plan as outlined above.  The patient is on IV heparin.  IV nitroglycerin is being administered.  Plan on cardiac catheterization and possible PCI tomorrow.  The patient's creatinine is elevated at 1.8 mg/dL.  We do not know his baseline.  We will follow-up labs in the morning, continue to diurese overnight as he is volume overloaded.  Risks, indications, and alternatives of cardiac catheterization and possible PCI have been reviewed with him and his family.  If he develops progressive chest pain will need to proceed emergently, otherwise plan to proceed tomorrow after he is further diuresed.  We will check a stat echocardiogram this afternoon as well.  His previous cath findings are reviewed with a chronically occluded right coronary artery and scattered disease elsewhere.  Tonny Bollman, M.D. 11/18/2017 1:34 PM

## 2017-11-05 NOTE — Progress Notes (Signed)
ANTICOAGULATION CONSULT NOTE - Initial Consult  Pharmacy Consult for Heparin Indication: chest pain/ACS  Allergies  Allergen Reactions  . Ambien [Zolpidem Tartrate]   . Codeine     Patient Measurements: Height: 6\' 1"  (185.4 cm) Weight: 225 lb (102.1 kg) IBW/kg (Calculated) : 79.9 Heparin Dosing Weight: 95 kg  Vital Signs: Temp: 97.5 F (36.4 C) (01/07 0208) Temp Source: Oral (01/07 0208) BP: 138/79 (01/07 0208) Pulse Rate: 102 (01/07 0208)  Labs: Recent Labs    11/26/2017 0223  HGB 12.2*  HCT 36.4*  PLT 144*  CREATININE 1.83*    Estimated Creatinine Clearance: 41.1 mL/min (A) (by C-G formula based on SCr of 1.83 mg/dL (H)).   Medical History: Past Medical History:  Diagnosis Date  . COPD (chronic obstructive pulmonary disease) (HCC)   . Coronary artery disease   . Diabetes mellitus without complication (HCC)   . Hyperlipidemia   . Hypertension     Medications:  No current facility-administered medications on file prior to encounter.    No current outpatient medications on file prior to encounter.     Assessment: 82 y.o. male with chest pain for heparin  Goal of Therapy:  Heparin level 0.3-0.7 units/ml Monitor platelets by anticoagulation protocol: Yes   Plan:  Heparin 4000 units IV bolus, then start heparin 1200 units/hr Check heparin level in 8 hours.   Eddie Candlebbott, Aireonna Bauer Vernon 11/21/2017,3:21 AM

## 2017-11-05 NOTE — ED Notes (Signed)
Admitting PA at bedside. Patient c/o intermittent chest pain states his "belly " feels tight

## 2017-11-05 NOTE — ED Notes (Signed)
Pt CBG 234, notified Dahlia ClientHannah Banker(RN)

## 2017-11-05 NOTE — ED Notes (Signed)
Dr Patria Maneampos given a copy of troponin .17

## 2017-11-05 NOTE — ED Provider Notes (Signed)
[facility] EMERGENCY DEPARTMENT Provider Note   CSN: [id number] Arrival date & time: 11/27/2017  0208     History   Chief Complaint Chief Complaint  Patient presents with  . Chest Pain  . Abnormal ECG  . Hypoxia    HPI Mark Flynn is a 82 y.o. male.  Patient with PMH of COPD, DM, HL, HTN, and CAD presents to the ED with a chief complaint of chest pain.  He reports that the pain started while at rest about 8 hours ago.  He reports having taken several SL nitro with no relief, but eventually did get some modest relief after being given nitro by EMS.  He also received ASA 324mg .  He reports that he has had ongoing dyspnea x 3 months  Reports that the has abdomen has been swollen and more distended despite taking more lasix.  He denies any abdominal pain.  He denies any lower extremity swelling.  He reports that he had a left heart cath at [facility] about 3 years ago which showed some blockages, but he was not stented and was treated medically.  Rates his pain as mild, but still present now.  He is noted to have a new oxygen requirement tonight.   The history is provided by the patient. No language interpreter was used.    Past Medical History:  Diagnosis Date  . COPD (chronic obstructive pulmonary disease) (HCC)   . Coronary artery disease   . Diabetes mellitus without complication (HCC)   . Hyperlipidemia   . Hypertension     There are no active problems to display for this patient.   Past Surgical History:  Procedure Laterality Date  . HERNIA REPAIR    . JOINT REPLACEMENT         Home Medications    Prior to Admission medications   Not on File    Family History History reviewed. No pertinent family history.  Social History Social History   Tobacco Use  . Smoking status: Former Smoker  Substance Use Topics  . Alcohol use: No    Frequency: Never  . Drug use: No     Allergies   Ambien [zolpidem tartrate] and Codeine   Review of  Systems Review of Systems  All other systems reviewed and are negative.    Physical Exam Updated Vital Signs BP 138/79 (BP Location: Left Arm)   Pulse (!) 102   Temp (!) 97.5 F (36.4 C) (Oral)   Resp 18   Ht 6\' 1"  (1.854 m)   Wt 102.1 kg (225 lb)   SpO2 90%   BMI 29.69 kg/m   Physical Exam  Constitutional: He is oriented to person, place, and time. He appears well-developed and well-nourished.  HENT:  Head: Normocephalic and atraumatic.  Eyes: Conjunctivae and EOM are normal. Pupils are equal, round, and reactive to light. Right eye exhibits no discharge. Left eye exhibits no discharge. No scleral icterus.  Neck: Normal range of motion. Neck supple. No JVD present.  Cardiovascular: Normal rate, regular rhythm and normal heart sounds. Exam reveals no gallop and no friction rub.  No murmur heard. Pulmonary/Chest: Effort normal and breath sounds normal. No respiratory distress. He has no wheezes. He has no rales. He exhibits no tenderness.  Diminished lung sounds  Abdominal: Soft. He exhibits no distension and no mass. There is no tenderness. There is no rebound and no guarding.  Musculoskeletal: Normal range of motion. He exhibits no edema or tenderness.  Neurological: He  is alert and oriented to person, place, and time.  Skin: Skin is warm and dry.  Psychiatric: He has a normal mood and affect. His behavior is normal. Judgment and thought content normal.  Nursing note and vitals reviewed.    ED Treatments / Results  Labs (all labs ordered are listed, but only abnormal results are displayed) Labs Reviewed  BASIC METABOLIC PANEL  CBC  I-STAT TROPONIN, ED    EKG  EKG Interpretation  Date/Time:  Monday November 05 2017 02:08:47 EST Ventricular Rate:  102 PR Interval:    QRS Duration: 181 QT Interval:  484 QTC Calculation: 631 R Axis:   48 Text Interpretation:  Sinus or ectopic atrial tachycardia Left bundle branch block No old tracing to compare Confirmed by  Azalia Bilis ([id number]) on 11/23/2017 2:23:56 AM       Radiology Ct Angio Chest Pe W And/or Wo Contrast  Result Date: 11/26/2017 CLINICAL DATA:  Chest pain.  PE suspected, high pretest prob EXAM: CT ANGIOGRAPHY CHEST WITH CONTRAST TECHNIQUE: Multidetector CT imaging of the chest was performed using the standard protocol during bolus administration of intravenous contrast. Multiplanar CT image reconstructions and MIPs were obtained to evaluate the vascular anatomy. CONTRAST:  65mL ISOVUE-370 IOPAMIDOL (ISOVUE-370) INJECTION 76% COMPARISON:  Chest radiograph earlier this day.  No prior chest CT. FINDINGS: Cardiovascular: There are no filling defects within the pulmonary arteries to suggest pulmonary embolus. Multi chamber cardiomegaly. Thoracic aorta is normal in caliber with minimal atherosclerotic calcifications. There are coronary artery calcifications. No pericardial effusion. Mediastinum/Nodes: Shotty mediastinal and bilateral hilar nodes. The esophagus is decompressed. No thyroid nodule. Lungs/Pleura: Diffuse bilateral ground-glass opacity, favored to be pulmonary edema. Small layering pleural effusions with fluid in the fissures. Smooth septal thickening. Trachea and mainstem bronchi are patent. Punctate calcified granuloma in the right lower lobe. Upper Abdomen: No acute finding. Musculoskeletal: Right shoulder arthroplasty. There are no acute or suspicious osseous abnormalities. Review of the MIP images confirms the above findings. IMPRESSION: 1. No pulmonary embolus. 2. Constellation of findings consistent with CHF with cardiomegaly, pleural effusions and septal thickening. Diffuse bilateral ground-glass opacities favored to be alveolar edema, less likely considerations include pneumonia or pulmonary hemorrhage. 3. Coronary artery calcifications.  Mild aortic atherosclerosis. Electronically Signed   By: Rubye Oaks M.D.   On: 11/15/2017 04:43   Dg Chest Port 1 View  Result Date:  10/31/2017 CLINICAL DATA:  Chest pain.  Shortness of breath. EXAM: PORTABLE CHEST 1 VIEW COMPARISON:  08/14/2016 FINDINGS: The heart is enlarged, slight progression from prior exam. Mild pulmonary edema. No evidence of pleural effusion. Linear atelectasis or scarring in the right midlung. No confluent airspace disease. No pneumothorax. Right shoulder arthroplasty. IMPRESSION: Cardiomegaly and pulmonary edema. Electronically Signed   By: Rubye Oaks M.D.   On: 11/11/2017 03:17    Procedures Procedures (including critical care time) CRITICAL CARE Performed by: Roxy Horseman   Total critical care time: 35 minutes  Critical care time was exclusive of separately billable procedures and treating other patients.  Critical care was necessary to treat or prevent imminent or life-threatening deterioration.  Critical care was time spent personally by me on the following activities: development of treatment plan with patient and/or surrogate as well as nursing, discussions with consultants, evaluation of patient's response to treatment, examination of patient, obtaining history from patient or surrogate, ordering and performing treatments and interventions, ordering and review of laboratory studies, ordering and review of radiographic studies, pulse oximetry and re-evaluation of patient's condition.  Medications  Ordered in ED Medications - No data to display   Initial Impression / Assessment and Plan / ED Course  I have reviewed the triage vital signs and the nursing notes.  Pertinent labs & imaging results that were available during my care of the patient were reviewed by me and considered in my medical decision making (see chart for details).    Patient with CP x 8 hours.  DOE x 3 months.  New O2 requirement today.  Still having some CP now, though this has improved.  Will treat pain, check labs and reassess.  Heparin started for possible NSTEMI given new CP today.  Troponin could be  elevated 2/2 demand ischemia from CHF.  BNP is pending. Also consider PE given marked hypoxia.  Will check CT PE.  PE study negative for PE, but findings are consistent with CHF.  [facility]  Final Clinical Impressions(s) / ED Diagnoses   Final diagnoses:  Chest pain, unspecified type  Hypoxia  Acute pulmonary edema [facility](HCC)    ED Discharge Orders    None       Roxy HorsemanBrowning, Iantha Titsworth, PA-C 02-May-2018 0524    Azalia Bilisampos, Kevin, MD 02-May-2018 [phone]

## 2017-11-05 NOTE — Progress Notes (Signed)
  Echocardiogram 2D Echocardiogram has been performed.  Mark Flynn T Susan Arana 11/02/2017, 2:08 PM

## 2017-11-05 NOTE — ED Notes (Signed)
Dr Patria Maneampos given a copy of troponin results .09

## 2017-11-05 NOTE — ED Triage Notes (Signed)
[facility] for 8 hrs beginning yesterday with SOB/DOE ongoing for 3 mo and takes NTG PRN for this; EMS reports EKG changes enroute to hospital with new RBBB; pt reports exploratory cath 3 yrs ago with some blockages but no stenting at that time; pain started at 7/10 and improved to 3/10 with 2 SL NTG and 324mg  ASA; pt states pain is midsternal and L sided and radiates to L ribs under axilla; EMS also noted pt to be hypoxic on arrival-- currently 92% on 6L [place]

## 2017-11-05 NOTE — ED Notes (Signed)
Transported to xray for CT scan

## 2017-11-05 NOTE — Progress Notes (Signed)
ANTICOAGULATION CONSULT NOTE - Follow Up Consult  Pharmacy Consult for Heparin Indication: chest pain/ACS  Allergies  Allergen Reactions  . Ambien [Zolpidem Tartrate] Other (See Comments)    Drives me crazy  . Codeine Other (See Comments)    unknown    Patient Measurements: Height: 6\' 1"  (185.4 cm) Weight: 225 lb (102.1 kg) IBW/kg (Calculated) : 79.9 Heparin Dosing Weight: 100.5 kg  Vital Signs: Temp: 97.7 F (36.5 C) (01/07 1523) Temp Source: Oral (01/07 1523) BP: 114/72 (01/07 1523) Pulse Rate: 89 (01/07 1522)  Labs: Recent Labs    11/04/2017 0223 10/31/2017 0405 11/03/2017 1014 11/22/2017 1417  HGB 12.2*  --   --   --   HCT 36.4*  --   --   --   PLT 144*  --   --   --   LABPROT  --   --  12.8  --   INR  --   --  0.98  --   HEPARINUNFRC  --  <0.10*  --  <0.10*  CREATININE 1.83*  --   --   --   TROPONINI  --   --  0.39*  --     Estimated Creatinine Clearance: 41.1 mL/min (A) (by C-G formula based on SCr of 1.83 mg/dL (H)).   Medical History: Past Medical History:  Diagnosis Date  . COPD (chronic obstructive pulmonary disease) (HCC)   . Coronary artery disease   . Diabetes mellitus without complication (HCC)   . Hyperlipidemia   . Hypertension     Medications:  No current facility-administered medications on file prior to encounter.    Current Outpatient Medications on File Prior to Encounter  Medication Sig Dispense Refill  . buPROPion (WELLBUTRIN XL) 300 MG 24 hr tablet Take 300 mg by mouth daily.    . carvedilol (COREG) 3.125 MG tablet Take 3.125 mg by mouth 2 (two) times daily with a meal.    . finasteride (PROSCAR) 5 MG tablet Take 5 mg by mouth daily.    Marland Kitchen FLUoxetine (PROZAC) 20 MG tablet Take 40 mg by mouth daily.    . furosemide (LASIX) 20 MG tablet Take 40 mg by mouth daily.    Marland Kitchen gabapentin (NEURONTIN) 300 MG capsule Take 300 mg by mouth 2 (two) times daily.    Marland Kitchen glimepiride (AMARYL) 4 MG tablet Take 4 mg by mouth daily with breakfast.    . insulin  detemir (LEVEMIR) 100 UNIT/ML injection Inject 12 Units into the skin every morning.    . isosorbide dinitrate (ISORDIL) 10 MG tablet Take 10 mg by mouth daily.    Marland Kitchen lisinopril (PRINIVIL,ZESTRIL) 5 MG tablet Take 2.5 mg by mouth daily.    . meloxicam (MOBIC) 15 MG tablet Take 7.5 mg by mouth daily.    . Multiple Vitamins-Minerals (HEALTHY EYES PO) Take 1 tablet by mouth daily.    . nitroGLYCERIN (NITROSTAT) 0.4 MG SL tablet Place 0.4 mg under the tongue every 5 (five) minutes as needed for chest pain.    Marland Kitchen omeprazole (PRILOSEC) 20 MG capsule Take 20 mg by mouth daily.    . ranolazine (RANEXA) 500 MG 12 hr tablet Take 500 mg by mouth every evening.    . sitaGLIPtin (JANUVIA) 100 MG tablet Take 100 mg by mouth daily.    . tamsulosin (FLOMAX) 0.4 MG CAPS capsule Take 0.4 mg by mouth daily.    Marland Kitchen terazosin (HYTRIN) 2 MG capsule Take 2 mg by mouth at bedtime.    . Vitamin D, Ergocalciferol, (  DRISDOL) 50000 units CAPS capsule Take 50,000 Units by mouth every 7 (seven) days.       Assessment: 82 y.o. male with chest pain for heparin. Plan for cardiac cath tomorrow. No prior to admission anticoag.  Heparin level ~9 after rate change came back undetectable (<0.1). No infusion issues per nursing. No signs/symptoms of bleeding.    Goal of Therapy:  Heparin level 0.3-0.7 units/ml Monitor platelets by anticoagulation protocol: Yes   Plan:  Heparin 3000 units IV bolus, then increase heparin to 1500 units/hr Check heparin level in 8 hours.  Daily heparin level, CBC  Girard CooterKimberly Perkins, PharmD Clinical Pharmacist  Pager: [phone] Phone: [phone] 11/04/2017,4:05 PM

## 2017-11-05 NOTE — H&P (Signed)
History and Physical    Mark Flynn ZOX:[id number] DOB: [birth year] DOA: 11/12/2017   PCP: Anselmo Pickler, MD   Patient coming from:  Home    Chief Complaint: Chest pain, shortness of breath  HPI: Mark Flynn is a 82 y.o. male with medical history significant for COPD, diabetes, hyperlipidemia, hypertension, history of CAD status post cardiac catheterization at [facility] about 3 years ago, which showed "some blockages, not stented, treated medically ", who presented to the emergency department with acute onset of chest pain while at rest last night.  He took several sublingual nitroglycerin without relief, as well as aspirin 324 mg on transit, with an extra dose of nitroglycerin, with some better control of his symptoms.  He states that on exertion, on with movement, this chest pain is also present.  He denies any prior symptoms as described.  The patient also reports ongoing dyspnea for the last 3 months, worse on exertion.  He is not on oxygen at home.  He recently quit tobacco about 1 week ago.  He denies any trauma to the chest.  He denies any recent long distance trips. No history of PE or DVT . He denies fevers, chills, night sweats or sick contacts.  He reports Increasing abdominal girth despite taking more Lasix.  The patient denies any nausea or vomiting.  He denies any urine retention  He does report intermittent lower extremity swelling, improved with Lasix.  He denies any calf pain. Patient drinks approximately 3-4 shots liquor a night. Denies recreational drug use.    ED Course:  BP (!) 123/59 (BP Location: Right Arm)   Pulse (!) 103   Temp (!) 97.5 F (36.4 C) (Oral)   Resp (!) 23   Ht 6\' 1"  (1.854 m)   Wt 102.1 kg (225 lb)   SpO2 95%   BMI 29.69 kg/m    Sinus or ectopic atrial tachycardia Left bundle branch block No old tracing to compare Troponin was elevated in the setting of demand ischemia from CHF, and hypoxia CT angiogram of the chest was negative for PE, findings  consistent with CHF, and pneumonia was suspected as well Heparin started per Pharmacy for possible NSTEMI Cardiology,Dr. Jacinto Halim, to see the patient today  Chemistries remarkable for glucose 291, creatinine 1.83 White count 9.4, hemoglobin 12.2, platelets 144  Review of Systems:  As per HPI otherwise all other systems reviewed and are negative  Past Medical History:  Diagnosis Date  . COPD (chronic obstructive pulmonary disease) (HCC)   . Coronary artery disease   . Diabetes mellitus without complication (HCC)   . Hyperlipidemia   . Hypertension     Past Surgical History:  Procedure Laterality Date  . HERNIA REPAIR    . JOINT REPLACEMENT      Social History Social History   Socioeconomic History  . Marital status: Widowed    Spouse name: Not on file  . Number of children: Not on file  . Years of education: Not on file  . Highest education level: Not on file  Social Needs  . Financial resource strain: Not on file  . Food insecurity - worry: Not on file  . Food insecurity - inability: Not on file  . Transportation needs - medical: Not on file  . Transportation needs - non-medical: Not on file  Occupational History  . Not on file  Tobacco Use  . Smoking status: Former Smoker  Substance and Sexual Activity  . Alcohol use: No    Frequency:  Never  . Drug use: No  . Sexual activity: Not on file  Other Topics Concern  . Not on file  Social History Narrative  . Not on file     Allergies  Allergen Reactions  . Ambien [Zolpidem Tartrate] Other (See Comments)    Drives me crazy  . Codeine Other (See Comments)    unknown    History reviewed. No pertinent family history.    Prior to Admission medications   Medication Sig Start Date End Date Taking? Authorizing Provider  buPROPion (WELLBUTRIN XL) 300 MG 24 hr tablet Take 300 mg by mouth daily.   Yes [provider]  carvedilol (COREG) 3.125 MG tablet Take 3.125 mg by mouth 2 (two) times daily with a meal.    Yes [provider]  finasteride (PROSCAR) 5 MG tablet Take 5 mg by mouth daily.   Yes [provider]  FLUoxetine (PROZAC) 20 MG tablet Take 40 mg by mouth daily.   Yes [provider]  furosemide (LASIX) 20 MG tablet Take 40 mg by mouth daily.   Yes [provider]  gabapentin (NEURONTIN) 300 MG capsule Take 300 mg by mouth 2 (two) times daily.   Yes [provider]  glimepiride (AMARYL) 4 MG tablet Take 4 mg by mouth daily with breakfast.   Yes [provider]  insulin detemir (LEVEMIR) 100 UNIT/ML injection Inject 12 Units into the skin every morning.   Yes [provider]  isosorbide dinitrate (ISORDIL) 10 MG tablet Take 10 mg by mouth daily.   Yes [provider]  lisinopril (PRINIVIL,ZESTRIL) 5 MG tablet Take 2.5 mg by mouth daily.   Yes [provider]  meloxicam (MOBIC) 15 MG tablet Take 7.5 mg by mouth daily.   Yes [provider]  Multiple Vitamins-Minerals (HEALTHY EYES PO) Take 1 tablet by mouth daily.   Yes [provider]  nitroGLYCERIN (NITROSTAT) 0.4 MG SL tablet Place 0.4 mg under the tongue every 5 (five) minutes as needed for chest pain.   Yes [provider]  omeprazole (PRILOSEC) 20 MG capsule Take 20 mg by mouth daily.   Yes [provider]  ranolazine (RANEXA) 500 MG 12 hr tablet Take 500 mg by mouth every evening.   Yes [provider]  sitaGLIPtin (JANUVIA) 100 MG tablet Take 100 mg by mouth daily.   Yes [provider]  tamsulosin (FLOMAX) 0.4 MG CAPS capsule Take 0.4 mg by mouth daily.   Yes [provider]  terazosin (HYTRIN) 2 MG capsule Take 2 mg by mouth at bedtime.   Yes [provider]  Vitamin D, Ergocalciferol, (DRISDOL) 50000 units CAPS capsule Take 50,000 Units by mouth every 7 (seven) days.   Yes [provider]    Physical Exam:  Vitals:   11/18/2017 0530 18-Nov-2017 0600 2017-11-18 0630 11/18/2017 0723   BP: 134/81 129/78 139/78 (!) 123/59  Pulse: (!) 110 98 99 (!) 103  Resp: (!) 22 (!) 24 19 (!) 23  Temp:      TempSrc:      SpO2: (!) 82% 92% (!) 89% 95%  Weight:      Height:       Constitutional:: Ill-appearing, somewhat diaphoretic, tachypnea.   Eyes: PERRL, lids and conjunctivae normal ENMT: Mucous membranes are moist, without exudate or lesions  Neck: normal, supple, no masses, no thyromegaly Respiratory: decreased breath sounds at the bases, bilateral wheezing, bibasilar  crackles. Moderate respiratory effort  Cardiovascular: tachy  rate and rhythm,  no   murmur, rubs or gallops. Pedal and ankle bilateral  edema. 2+ pedal pulses. No carotid bruits.  Abdomen:  Distended, non tender, No hepatosplenomegaly palpable . Bowel sounds positive.  Musculoskeletal: no clubbing / cyanosis. Moves all extremities Skin: no jaundice, No lesions.  Neurologic: Sensation intact  Strength equal in all extremities Psychiatric:   Alert and oriented x 3. Somewhat anxious  mood.     Labs on Admission: I have personally reviewed following labs and imaging studies  CBC: `` Recent Labs  Lab 11/28/2017 0223  WBC 9.4  HGB 12.2*  HCT 36.4*  MCV 91.0  PLT 144*    Basic Metabolic Panel: Recent Labs  Lab 11/16/2017 0223  NA 137  K 3.8  CL 105  CO2 21*  GLUCOSE 291*  BUN 32*  CREATININE 1.83*  CALCIUM 7.9*    GFR: Estimated Creatinine Clearance: 41.1 mL/min (A) (by C-G formula based on SCr of 1.83 mg/dL (H)).  Liver Function Tests: No results for input(s): AST, ALT, ALKPHOS, BILITOT, PROT, ALBUMIN in the last 168 hours. No results for input(s): LIPASE, AMYLASE in the last 168 hours. No results for input(s): AMMONIA in the last 168 hours.  Coagulation Profile: No results for input(s): INR, PROTIME in the last 168 hours.  Cardiac Enzymes: No results for input(s): CKTOTAL, CKMB, CKMBINDEX, TROPONINI in the last 168 hours.  BNP (last 3 results) No results for input(s): PROBNP in the last  8760 hours.  HbA1C: No results for input(s): HGBA1C in the last 72 hours.  CBG: No results for input(s): GLUCAP in the last 168 hours.  Lipid Profile: No results for input(s): CHOL, HDL, LDLCALC, TRIG, CHOLHDL, LDLDIRECT in the last 72 hours.  Thyroid Function Tests: No results for input(s): TSH, T4TOTAL, FREET4, T3FREE, THYROIDAB in the last 72 hours.  Anemia Panel: No results for input(s): VITAMINB12, FOLATE, FERRITIN, TIBC, IRON, RETICCTPCT in the last 72 hours.  Urine analysis: No results found for: COLORURINE, APPEARANCEUR, LABSPEC, PHURINE, GLUCOSEU, HGBUR, BILIRUBINUR, KETONESUR, PROTEINUR, UROBILINOGEN, NITRITE, LEUKOCYTESUR  Sepsis Labs: @LABRCNTIP (procalcitonin:4,lacticidven:4) )No results found for this or any previous visit (from the past 240 hour(s)).   Radiological Exams on Admission: Ct Angio Chest Pe W And/or Wo Contrast  Result Date: 11/27/2017 CLINICAL DATA:  Chest pain.  PE suspected, high pretest prob EXAM: CT ANGIOGRAPHY CHEST WITH CONTRAST TECHNIQUE: Multidetector CT imaging of the chest was performed using the standard protocol during bolus administration of intravenous contrast. Multiplanar CT image reconstructions and MIPs were obtained to evaluate the vascular anatomy. CONTRAST:  65mL ISOVUE-370 IOPAMIDOL (ISOVUE-370) INJECTION 76% COMPARISON:  Chest radiograph earlier this day.  No prior chest CT. FINDINGS: Cardiovascular: There are no filling defects within the pulmonary arteries to suggest pulmonary embolus. Multi chamber cardiomegaly. Thoracic aorta is normal in caliber with minimal atherosclerotic calcifications. There are coronary artery calcifications. No pericardial effusion. Mediastinum/Nodes: Shotty mediastinal and bilateral hilar nodes. The esophagus is decompressed. No thyroid nodule. Lungs/Pleura: Diffuse bilateral ground-glass opacity, favored to be pulmonary edema. Small layering pleural effusions with fluid in the fissures. Smooth septal thickening.  Trachea and mainstem bronchi are patent. Punctate calcified granuloma in the right lower lobe. Upper Abdomen: No acute finding. Musculoskeletal: Right shoulder arthroplasty. There are no acute or suspicious osseous abnormalities. Review of the MIP images confirms the above findings. IMPRESSION: 1. No pulmonary embolus. 2. Constellation of findings consistent with CHF with cardiomegaly, pleural effusions and septal thickening. Diffuse bilateral ground-glass opacities favored to be alveolar edema, less likely considerations include pneumonia or pulmonary  hemorrhage. 3. Coronary artery calcifications.  Mild aortic atherosclerosis. Electronically Signed   By: Rubye Oaks M.D.   On: 11/29/2017 04:43   Dg Chest Port 1 View  Result Date: 11/28/2017 CLINICAL DATA:  Chest pain.  Shortness of breath. EXAM: PORTABLE CHEST 1 VIEW COMPARISON:  08/14/2016 FINDINGS: The heart is enlarged, slight progression from prior exam. Mild pulmonary edema. No evidence of pleural effusion. Linear atelectasis or scarring in the right midlung. No confluent airspace disease. No pneumothorax. Right shoulder arthroplasty. IMPRESSION: Cardiomegaly and pulmonary edema. Electronically Signed   By: Rubye Oaks M.D.   On: 11/07/2017 03:17    EKG: Independently reviewed.  Assessment/Plan Active Problems:   Chest pain   Chronic kidney disease, stage II (mild)   Coronary artery disease involving native coronary artery   Dementia associated with other underlying disease without behavioral disturbance   Essential hypertension   Type 2 diabetes mellitus (HCC)   Hyperlipidemia   Acute respiratory failure with hypoxia (HCC)   Depression   BPH (benign prostatic hyperplasia)  Acute hypoxic respiratory failure in the setting of new onset of  CHF and COPD exacerbation.  CT angiogram of the chest was negative for PE, findings consistent with CHF. Osats were in the 80s, improved to the 90s with 2 L of oxygen. Afebrile   BNP in the 400s.   White count is normal.  weight 225 pounds.  No BiPAP was needed.  He was given Lasix 40 mg IV x1 at 5 AM.  The patient is beginning to diurese.  Cardiology to see, Dr. Baldwin [place] obs  Place telemetry obs  CHF  And COPD order set  2 D echo  IV Lasix 40 mg bid  Strict I/Os Duoneb q 4 hours, and albuterol every 2 hours as needed for wheezing Consider steroids if respiratory status worsens Continue Isordil, dose as per Cards    Chest pain syndrome/known CAD  HEART score 4. Troponin currently 0.1 in the setting of demand ischemia from CHF, and hypoxia. 7, EKG suspicious for NSTEMI. CP relief by nitroglycerin to 4 doses.  Chest x-ray and CT angio consistent with CHF, with cardiomegaly and pulmonary edema.. Heparin per Pharmacy initiated   Admit to Telemetry/ Observation Chest pain order set Cycle troponins Serial EKG continue ASA, O2 and NTG as needed Continue Heparin per Pharmacy  GI cocktail Check Lipid panel  Hb A1C  Consult to Cards, Dr. Jacinto Halim, appreciate involvement. May need cardiac cath in am        Hypertension BP  123/59  Pulse  103   Continue home anti-hypertensive medications   Hyperlipidemia Continue home statins Lipid panel   GERD, no acute symptoms Continue Ranexa  Chronic kidney disease stage 2   Unknown  baseline creatinine, currently 1.83  Lab Results  Component Value Date   CREATININE 1.83 (H) 11/22/2017  Hold ACEI and NSAIDS  Repeat CMET in am   Type II Diabetes with neuropathy Current blood sugar level is 291 No results found for: HGBA1C Hgb A1C Hold home oral diabetic medications.  Continue Levemir at 12 U daily   SSI Continue Neurontin    History of alcohol habituation at risk for withdrawal.  Last drink yesterday, 4 shots  Telemetry  CIWA with Ativan per protocol  Thiamine, folate, and MVI  Depression Continue home Prozac  Benign prostatic hypertrophy,  Continue Proscar and Hytrin      DVT prophylaxis:  Heparin per Pharmacy in  vuiew of NSTEMI  Code Status: DNR  Family  Communication:  Discussed with patient and son  Disposition Plan: Expect patient to be discharged to home after condition improves Consults called:    Cardiology, Dr. Jacinto Halim Admission status: Tele Obs    Mark Kays, PA-C [facility]   Amion text  [phone]   2017-12-01, 8:46 AM

## 2017-11-05 NOTE — ED Notes (Signed)
States he is having a little upper abd. Pain no chest pain at present.

## 2017-11-05 NOTE — Consult Note (Signed)
CARDIOLOGY CONSULT NOTE  Patient ID: Mark Flynn MRN: [id number] DOB/AGE: 1936/01/16 82 y.o.  Admit date: 11/13/2017 Referring Physician  Jonah Blue Primary Physician:  Anselmo Pickler, MD Reason for Consultation  NSTEMI  HPI: Mark Flynn  is a 82 y.o. male  With known coronary artery disease, appears that he has had cardiac catheterization about 2-3 years ago and was told to have an occluded coronary artery, not felt to be amenable for angioplasty and moderate disease in the other arteries, being treated medically, has history of diabetes mellitus, chronic kidney disease, hypertension and hyperlipidemia.  Prior tobacco use disorder.  He has been experiencing worsening shortness of breath and dyspnea on exertion over the past 1 year, yesterday around 11 PM, started noticing chest tightness in the middle of the chest associated with marked dyspnea, was not being relieved and was getting worse.  Hence he called EMS and was brought to the hospital for further management.  Since being in the hospital and starting on IV heparin, states that his chest pain symptoms have improved.  He still feels short of breath.  His son is present at the bedside.  He was seeing Dr. Arnoldo Morale in the past, last seen him in 2016.  Past Medical History:  Diagnosis Date  . COPD (chronic obstructive pulmonary disease) (HCC)   . Coronary artery disease   . Diabetes mellitus without complication (HCC)   . Hyperlipidemia   . Hypertension      Past Surgical History:  Procedure Laterality Date  . HERNIA REPAIR    . JOINT REPLACEMENT       History reviewed. No pertinent family history.   Social History: Social History   Socioeconomic History  . Marital status: Widowed    Spouse name: Not on file  . Number of children: Not on file  . Years of education: Not on file  . Highest education level: Not on file  Social Needs  . Financial resource strain: Not on file  . Food insecurity - worry: Not on file  .  Food insecurity - inability: Not on file  . Transportation needs - medical: Not on file  . Transportation needs - non-medical: Not on file  Occupational History  . Not on file  Tobacco Use  . Smoking status: Former Smoker  Substance and Sexual Activity  . Alcohol use: No    Frequency: Never  . Drug use: No  . Sexual activity: Not on file  Other Topics Concern  . Not on file  Social History Narrative  . Not on file      (Not in a hospital admission)   Review of Systems - Psychological ROS: negative for - anxiety, behavioral disorder or depression Hematological and Lymphatic ROS: negative for - bleeding problems, blood clots, swollen lymph nodes or weight loss Endocrine ROS: negative for - galactorrhea, hair pattern changes, mood swings, palpitations, polydipsia/polyuria or unexpected weight changes Respiratory ROS: positive for - orthopnea and shortness of breath Cardiovascular ROS: positive for - chest pain, dyspnea on exertion, paroxysmal nocturnal dyspnea and claudication Gastrointestinal ROS: negative for - abdominal pain, appetite loss, blood in stools or change in bowel habits Musculoskeletal ROS: negative for - joint pain, joint stiffness or joint swelling Neurological ROS: no TIA or stroke symptoms    Physical Exam: Blood pressure (!) 123/59, pulse (!) 103, temperature (!) 97.5 F (36.4 C), temperature source Oral, resp. rate (!) 23, height 6\' 1"  (1.854 m), weight 102.1 kg (225 lb), SpO2 95 %.   General  appearance: alert, cooperative, appears stated age, no distress and moderately obese Lungs: clear to auscultation bilaterally Chest wall: no tenderness Heart: S1 is normal, S2 is paradoxically split.  Distant heart sounds.  Cannot exclude presence of S4 gallop.  No murmur appreciated. Abdomen: soft, non-tender; bowel sounds normal; no masses,  no organomegaly and Obese, mild pannus present. Extremities: extremities normal, atraumatic, no cyanosis or edema Pulses: Faint  right carotid bruit present.  Bilateral femorals difficult to feel due to obesity.  Right popliteal 2+, left popliteal 1+.  Bilateral pedal pulse absent.  Normal capillary refill. Neurologic: Grossly normal  Labs:  Lab Results  Component Value Date   WBC 9.4 11/21/2017   HGB 12.2 (L) 11/20/2017   HCT 36.4 (L) 11/11/2017   MCV 91.0 11/14/2017   PLT 144 (L) 11/09/2017   Recent Labs  Lab 11/11/2017 0223  NA 137  K 3.8  CL 105  CO2 21*  BUN 32*  CREATININE 1.83*  CALCIUM 7.9*  GLUCOSE 291*    Lipid Panel  No results found for: CHOL, TRIG, HDL, CHOLHDL, VLDL, LDLCALC  BNP (last 3 results) Recent Labs    11/24/2017 0430  BNP 410.1*     Radiology: Ct Angio Chest Pe W And/or Wo Contrast  Result Date: 11/02/2017 CLINICAL DATA:  Chest pain.  PE suspected, high pretest prob EXAM: CT ANGIOGRAPHY CHEST WITH CONTRAST TECHNIQUE: Multidetector CT imaging of the chest was performed using the standard protocol during bolus administration of intravenous contrast. Multiplanar CT image reconstructions and MIPs were obtained to evaluate the vascular anatomy. CONTRAST:  65mL ISOVUE-370 IOPAMIDOL (ISOVUE-370) INJECTION 76% COMPARISON:  Chest radiograph earlier this day.  No prior chest CT. FINDINGS: Cardiovascular: There are no filling defects within the pulmonary arteries to suggest pulmonary embolus. Multi chamber cardiomegaly. Thoracic aorta is normal in caliber with minimal atherosclerotic calcifications. There are coronary artery calcifications. No pericardial effusion. Mediastinum/Nodes: Shotty mediastinal and bilateral hilar nodes. The esophagus is decompressed. No thyroid nodule. Lungs/Pleura: Diffuse bilateral ground-glass opacity, favored to be pulmonary edema. Small layering pleural effusions with fluid in the fissures. Smooth septal thickening. Trachea and mainstem bronchi are patent. Punctate calcified granuloma in the right lower lobe. Upper Abdomen: No acute finding. Musculoskeletal: Right  shoulder arthroplasty. There are no acute or suspicious osseous abnormalities. Review of the MIP images confirms the above findings. IMPRESSION: 1. No pulmonary embolus. 2. Constellation of findings consistent with CHF with cardiomegaly, pleural effusions and septal thickening. Diffuse bilateral ground-glass opacities favored to be alveolar edema, less likely considerations include pneumonia or pulmonary hemorrhage. 3. Coronary artery calcifications.  Mild aortic atherosclerosis. Electronically Signed   By: Rubye Oaks M.D.   On: 11/23/2017 04:43   Dg Chest Port 1 View  Result Date: 11/20/2017 CLINICAL DATA:  Chest pain.  Shortness of breath. EXAM: PORTABLE CHEST 1 VIEW COMPARISON:  08/14/2016 FINDINGS: The heart is enlarged, slight progression from prior exam. Mild pulmonary edema. No evidence of pleural effusion. Linear atelectasis or scarring in the right midlung. No confluent airspace disease. No pneumothorax. Right shoulder arthroplasty. IMPRESSION: Cardiomegaly and pulmonary edema. Electronically Signed   By: Rubye Oaks M.D.   On: 11/23/2017 03:17    Scheduled Meds: . buPROPion  300 mg Oral Daily  . carvedilol  3.125 mg Oral BID WC  . finasteride  5 mg Oral Daily  . FLUoxetine  40 mg Oral Daily  . folic acid  1 mg Oral Daily  . furosemide  40 mg Intravenous BID  . gabapentin  300  mg Oral BID  . insulin aspart  0-9 Units Subcutaneous TID WC  . insulin detemir  6 Units Subcutaneous [id number]  . isosorbide dinitrate  10 mg Oral Daily  . [START ON 11/03/2017] meloxicam  7.5 mg Oral Daily  . multivitamin with minerals  1 tablet Oral Daily  . pantoprazole  40 mg Oral Daily  . ranolazine  500 mg Oral QPM  . tamsulosin  0.4 mg Oral Daily  . terazosin  2 mg Oral QHS  . thiamine  100 mg Oral Daily   Or  . thiamine  100 mg Intravenous Daily  . [START ON 11/08/2017] Vitamin D (Ergocalciferol)  50,000 Units Oral Q7 days   Continuous Infusions: . heparin 1,200 Units/hr (11/12/2017 0434)    PRN Meds:.acetaminophen, ALPRAZolam, gi cocktail, LORazepam **OR** LORazepam, ondansetron (ZOFRAN) IV  CARDIAC STUDIES:  EKG: Normal sinus rhythm, left bundle branch block.  ECHO: Pending.  ASSESSMENT AND PLAN:  1.  Unstable angina, probable non-STEMI.  Per history, known coronary artery disease with occluded coronary artery, felt to be non-amenable for PCI, moderate disease in the other vessels. 2.  Acute on chronic diastolic heart failure. 3.  Diabetes mellitus type 2 with stage III chronic kidney disease, unknown status with regard to control.  Without hypoglycemia. 4.  Hyperlipidemia 5.  Peripheral arterial disease with symptoms of claudication involving bilateral calves.  Abnormal physical exam.  Recommendation: Patient will need further cardiac workup.  Echocardiogram is pending.  Consider cardiac catheterization once hydration is achieved and serum creatinine has been followed through.  Continue IV heparin for now.  Add statin.  I will request [facility] to take over.  Yates DecampJay Arney Mayabb, MD 11/12/2017, 8:18 AM [facility] [place] Pager: [phone] Office: [phone] If no answer Cell [phone]

## 2017-11-06 ENCOUNTER — Encounter (HOSPITAL_COMMUNITY): Payer: Self-pay | Admitting: Cardiovascular Disease

## 2017-11-06 ENCOUNTER — Inpatient Hospital Stay (HOSPITAL_COMMUNITY): Payer: Medicare Other

## 2017-11-06 ENCOUNTER — Encounter (HOSPITAL_COMMUNITY): Admission: EM | Disposition: E | Payer: Self-pay | Source: Home / Self Care | Attending: Internal Medicine

## 2017-11-06 DIAGNOSIS — R0902 Hypoxemia: Secondary | ICD-10-CM

## 2017-11-06 DIAGNOSIS — E785 Hyperlipidemia, unspecified: Secondary | ICD-10-CM

## 2017-11-06 DIAGNOSIS — I5023 Acute on chronic systolic (congestive) heart failure: Secondary | ICD-10-CM

## 2017-11-06 HISTORY — PX: LEFT HEART CATH AND CORONARY ANGIOGRAPHY: CATH118249

## 2017-11-06 LAB — BASIC METABOLIC PANEL
Anion gap: 11 (ref 5–15)
BUN: 32 mg/dL — AB (ref 6–20)
CO2: 24 mmol/L (ref 22–32)
CREATININE: 1.93 mg/dL — AB (ref 0.61–1.24)
Calcium: 8 mg/dL — ABNORMAL LOW (ref 8.9–10.3)
Chloride: 101 mmol/L (ref 101–111)
GFR calc Af Amer: 36 mL/min — ABNORMAL LOW (ref >60)
GFR, EST NON AFRICAN AMERICAN: 31 mL/min — AB (ref >60)
GLUCOSE: 206 mg/dL — AB (ref 65–99)
POTASSIUM: 3.4 mmol/L — AB (ref 3.5–5.1)
SODIUM: 136 mmol/L (ref 135–145)

## 2017-11-06 LAB — ECHOCARDIOGRAM COMPLETE
Height: 73 in
Weight: 3600 oz

## 2017-11-06 LAB — CBC
HCT: 34.4 % — ABNORMAL LOW (ref 39.0–52.0)
Hemoglobin: 11.5 g/dL — ABNORMAL LOW (ref 13.0–17.0)
MCH: 30.2 pg (ref 26.0–34.0)
MCHC: 33.4 g/dL (ref 30.0–36.0)
MCV: 90.3 fL (ref 78.0–100.0)
PLATELETS: 134 10*3/uL — AB (ref 150–400)
RBC: 3.81 MIL/uL — AB (ref 4.22–5.81)
RDW: 15.1 % (ref 11.5–15.5)
WBC: 9.5 10*3/uL (ref 4.0–10.5)

## 2017-11-06 LAB — GLUCOSE, CAPILLARY
GLUCOSE-CAPILLARY: 215 mg/dL — AB (ref 65–99)
GLUCOSE-CAPILLARY: 215 mg/dL — AB (ref 65–99)
GLUCOSE-CAPILLARY: 176 mg/dL — AB (ref 65–99)
GLUCOSE-CAPILLARY: 184 mg/dL — AB (ref 65–99)
Glucose-Capillary: 188 mg/dL — ABNORMAL HIGH (ref 65–99)

## 2017-11-06 LAB — LIPID PANEL
CHOLESTEROL: 275 mg/dL — AB (ref 0–200)
HDL: 38 mg/dL — ABNORMAL LOW (ref >40)
LDL Cholesterol: UNDETERMINED mg/dL (ref 0–99)
Total CHOL/HDL Ratio: 7.2 RATIO
Triglycerides: 654 mg/dL — ABNORMAL HIGH (ref <150)
VLDL: UNDETERMINED mg/dL (ref 0–40)

## 2017-11-06 LAB — HEPARIN LEVEL (UNFRACTIONATED): HEPARIN UNFRACTIONATED: 0.24 [IU]/mL — AB (ref 0.30–0.70)

## 2017-11-06 SURGERY — LEFT HEART CATH AND CORONARY ANGIOGRAPHY
Anesthesia: LOCAL

## 2017-11-06 MED ORDER — NITROGLYCERIN 0.4 MG SL SUBL
SUBLINGUAL_TABLET | SUBLINGUAL | Status: AC
  Filled 2017-11-06: qty 1

## 2017-11-06 MED ORDER — SODIUM CHLORIDE 0.9 % IV SOLN: INTRAVENOUS | Status: DC

## 2017-11-06 MED ORDER — ONDANSETRON HCL 4 MG/2ML IJ SOLN
4.0000 mg | Freq: Four times a day (QID) | INTRAMUSCULAR | Status: DC | PRN
  Administered 2017-11-06 – 2017-11-08 (×2): 4 mg via INTRAVENOUS
  Filled 2017-11-06 (×2): qty 2

## 2017-11-06 MED ORDER — IOPAMIDOL (ISOVUE-370) INJECTION 76%
INTRAVENOUS | Status: AC
  Filled 2017-11-06: qty 100

## 2017-11-06 MED ORDER — METHYLPREDNISOLONE SODIUM SUCC 40 MG IJ SOLR
40.0000 mg | Freq: Once | INTRAMUSCULAR | Status: AC
  Administered 2017-11-07: 40 mg via INTRAVENOUS
  Filled 2017-11-06: qty 1

## 2017-11-06 MED ORDER — VERAPAMIL HCL 2.5 MG/ML IV SOLN
INTRAVENOUS | Status: DC | PRN
  Administered 2017-11-06: 10 mL via INTRA_ARTERIAL

## 2017-11-06 MED ORDER — AMIODARONE HCL IN DEXTROSE 360-4.14 MG/200ML-% IV SOLN
INTRAVENOUS | Status: AC
  Filled 2017-11-06: qty 200

## 2017-11-06 MED ORDER — AMIODARONE HCL IN DEXTROSE 360-4.14 MG/200ML-% IV SOLN: 30.0000 mg/h | INTRAVENOUS | Status: DC

## 2017-11-06 MED ORDER — ORAL CARE MOUTH RINSE
15.0000 mL | Freq: Two times a day (BID) | OROMUCOSAL | Status: DC
  Administered 2017-11-06 – 2017-11-09 (×6): 15 mL via OROMUCOSAL

## 2017-11-06 MED ORDER — HEPARIN (PORCINE) IN NACL 2-0.9 UNIT/ML-% IJ SOLN
INTRAMUSCULAR | Status: AC
  Filled 2017-11-06: qty 1000

## 2017-11-06 MED ORDER — NITROGLYCERIN IN D5W 200-5 MCG/ML-% IV SOLN
0.0000 ug/min | INTRAVENOUS | Status: DC
  Filled 2017-11-06: qty 250

## 2017-11-06 MED ORDER — AMIODARONE LOAD VIA INFUSION
150.0000 mg | Freq: Once | INTRAVENOUS | Status: AC
Start: 2017-11-06 — End: 2017-11-06
  Administered 2017-11-06: 150 mg via INTRAVENOUS

## 2017-11-06 MED ORDER — FUROSEMIDE 10 MG/ML IJ SOLN
60.0000 mg | Freq: Two times a day (BID) | INTRAMUSCULAR | Status: DC
  Administered 2017-11-06 – 2017-11-07 (×2): 60 mg via INTRAVENOUS
  Filled 2017-11-06 (×2): qty 6

## 2017-11-06 MED ORDER — SODIUM CHLORIDE 0.9 % IV SOLN
INTRAVENOUS | Status: DC
  Administered 2017-11-06: 05:00:00 via INTRAVENOUS

## 2017-11-06 MED ORDER — SODIUM CHLORIDE 0.9 % IV SOLN: 250.0000 mL | INTRAVENOUS | Status: DC | PRN

## 2017-11-06 MED ORDER — POTASSIUM CHLORIDE CRYS ER 20 MEQ PO TBCR
20.0000 meq | EXTENDED_RELEASE_TABLET | Freq: Once | ORAL | Status: AC
  Administered 2017-11-06: 20 meq via ORAL
  Filled 2017-11-06: qty 1

## 2017-11-06 MED ORDER — HEPARIN (PORCINE) IN NACL 2-0.9 UNIT/ML-% IJ SOLN
INTRAMUSCULAR | Status: AC | PRN
  Administered 2017-11-06: 1000 mL via INTRA_ARTERIAL

## 2017-11-06 MED ORDER — MIDAZOLAM HCL 2 MG/2ML IJ SOLN
INTRAMUSCULAR | Status: AC
  Filled 2017-11-06: qty 2

## 2017-11-06 MED ORDER — SODIUM CHLORIDE 0.9% FLUSH
3.0000 mL | Freq: Two times a day (BID) | INTRAVENOUS | Status: DC
  Administered 2017-11-06 – 2017-11-07 (×2): 3 mL via INTRAVENOUS

## 2017-11-06 MED ORDER — LIDOCAINE HCL (PF) 1 % IJ SOLN
INTRAMUSCULAR | Status: DC | PRN
  Administered 2017-11-06: 2 mL

## 2017-11-06 MED ORDER — INSULIN DETEMIR 100 UNIT/ML ~~LOC~~ SOLN
12.0000 [IU] | Freq: Every day | SUBCUTANEOUS | Status: DC
  Administered 2017-11-06 – 2017-11-07 (×2): 12 [IU] via SUBCUTANEOUS
  Filled 2017-11-06 (×2): qty 0.12

## 2017-11-06 MED ORDER — MORPHINE SULFATE (PF) 4 MG/ML IV SOLN
2.0000 mg | INTRAVENOUS | Status: DC | PRN
  Administered 2017-11-07 (×2): 2 mg via INTRAVENOUS
  Filled 2017-11-06 (×3): qty 1

## 2017-11-06 MED ORDER — HEPARIN (PORCINE) IN NACL 100-0.45 UNIT/ML-% IJ SOLN
1900.0000 [IU]/h | INTRAMUSCULAR | Status: DC
  Administered 2017-11-07: 1700 [IU]/h via INTRAVENOUS
  Administered 2017-11-08 (×2): 1900 [IU]/h via INTRAVENOUS
  Filled 2017-11-06 (×5): qty 250

## 2017-11-06 MED ORDER — ASPIRIN 81 MG PO CHEW
81.0000 mg | CHEWABLE_TABLET | Freq: Every day | ORAL | Status: DC
Start: 2017-11-07 — End: 2017-11-10
  Administered 2017-11-07 – 2017-11-08 (×2): 81 mg via ORAL
  Filled 2017-11-06 (×2): qty 1

## 2017-11-06 MED ORDER — LIDOCAINE HCL (PF) 1 % IJ SOLN
INTRAMUSCULAR | Status: AC
  Filled 2017-11-06: qty 30

## 2017-11-06 MED ORDER — ACETAMINOPHEN 325 MG PO TABS: 650.0000 mg | ORAL_TABLET | ORAL | Status: DC | PRN

## 2017-11-06 MED ORDER — DIAZEPAM 5 MG PO TABS
5.0000 mg | ORAL_TABLET | Freq: Four times a day (QID) | ORAL | Status: DC | PRN
  Administered 2017-11-07: 5 mg via ORAL
  Filled 2017-11-06: qty 1

## 2017-11-06 MED ORDER — NITROGLYCERIN IN D5W 200-5 MCG/ML-% IV SOLN
0.0000 ug/min | INTRAVENOUS | Status: DC
  Administered 2017-11-06: 5 ug/min via INTRAVENOUS

## 2017-11-06 MED ORDER — SODIUM CHLORIDE 0.9 % IV SOLN: INTRAVENOUS | Status: AC

## 2017-11-06 MED ORDER — FUROSEMIDE 10 MG/ML IJ SOLN
40.0000 mg | Freq: Once | INTRAMUSCULAR | Status: AC
  Administered 2017-11-06: 40 mg via INTRAVENOUS
  Filled 2017-11-06: qty 4

## 2017-11-06 MED ORDER — VERAPAMIL HCL 2.5 MG/ML IV SOLN
INTRAVENOUS | Status: AC
  Filled 2017-11-06: qty 2

## 2017-11-06 MED ORDER — SODIUM CHLORIDE 0.9% FLUSH: 3.0000 mL | Freq: Two times a day (BID) | INTRAVENOUS | Status: DC

## 2017-11-06 MED ORDER — HEPARIN SODIUM (PORCINE) 1000 UNIT/ML IJ SOLN
INTRAMUSCULAR | Status: AC
  Filled 2017-11-06: qty 1

## 2017-11-06 MED ORDER — NITROGLYCERIN 0.4 MG SL SUBL
0.4000 mg | SUBLINGUAL_TABLET | SUBLINGUAL | Status: DC | PRN
  Administered 2017-11-06 (×2): 0.4 mg via SUBLINGUAL

## 2017-11-06 MED ORDER — AMIODARONE HCL IN DEXTROSE 360-4.14 MG/200ML-% IV SOLN
60.0000 mg/h | INTRAVENOUS | Status: DC
  Administered 2017-11-06 (×2): 60 mg/h via INTRAVENOUS
  Filled 2017-11-06: qty 200

## 2017-11-06 MED ORDER — SODIUM CHLORIDE 0.9% FLUSH: 3.0000 mL | INTRAVENOUS | Status: DC | PRN

## 2017-11-06 MED ORDER — IOPAMIDOL (ISOVUE-370) INJECTION 76%
INTRAVENOUS | Status: DC | PRN
  Administered 2017-11-06: 100 mL via INTRA_ARTERIAL

## 2017-11-06 MED ORDER — MIDAZOLAM HCL 2 MG/2ML IJ SOLN
INTRAMUSCULAR | Status: DC | PRN
  Administered 2017-11-06: 0.5 mg via INTRAVENOUS

## 2017-11-06 MED ORDER — HEPARIN SODIUM (PORCINE) 1000 UNIT/ML IJ SOLN
INTRAMUSCULAR | Status: DC | PRN
  Administered 2017-11-06: 5000 [IU] via INTRAVENOUS

## 2017-11-06 MED ORDER — ALBUTEROL (5 MG/ML) CONTINUOUS INHALATION SOLN
10.0000 mg/h | INHALATION_SOLUTION | RESPIRATORY_TRACT | Status: AC
  Administered 2017-11-06: 10 mg/h via RESPIRATORY_TRACT
  Filled 2017-11-06: qty 20

## 2017-11-06 MED ORDER — PROMETHAZINE HCL 25 MG/ML IJ SOLN
12.5000 mg | Freq: Four times a day (QID) | INTRAMUSCULAR | Status: DC | PRN
  Administered 2017-11-08: 12.5 mg via INTRAVENOUS
  Filled 2017-11-06 (×2): qty 1

## 2017-11-06 MED ORDER — IPRATROPIUM-ALBUTEROL 0.5-2.5 (3) MG/3ML IN SOLN
3.0000 mL | Freq: Four times a day (QID) | RESPIRATORY_TRACT | Status: DC
  Administered 2017-11-06: 3 mL via RESPIRATORY_TRACT
  Filled 2017-11-06: qty 3

## 2017-11-06 SURGICAL SUPPLY — 12 items
CATH 5FR JL3.5 JR4 ANG PIG MP (CATHETERS) ×2 IMPLANT
CATH INFINITI 5FR JL4 (CATHETERS) ×2 IMPLANT
CATH OPTITORQUE TIG 4.0 5F (CATHETERS) ×2 IMPLANT
DEVICE RAD COMP TR BAND LRG (VASCULAR PRODUCTS) ×2 IMPLANT
GLIDESHEATH SLEND A-KIT 6F 22G (SHEATH) ×2 IMPLANT
GLIDESHEATH SLEND SS 6F .021 (SHEATH) ×2 IMPLANT
GUIDEWIRE INQWIRE 1.5J.035X260 (WIRE) ×1 IMPLANT
INQWIRE 1.5J .035X260CM (WIRE) ×2
KIT HEART LEFT (KITS) ×2 IMPLANT
PACK CARDIAC CATHETERIZATION (CUSTOM PROCEDURE TRAY) ×2 IMPLANT
TRANSDUCER W/STOPCOCK (MISCELLANEOUS) ×2 IMPLANT
TUBING CIL FLEX 10 FLL-RA (TUBING) ×2 IMPLANT

## 2017-11-06 NOTE — Progress Notes (Signed)
Nitro sl given.

## 2017-11-06 NOTE — Progress Notes (Signed)
Called to assess patient due to shortness of breath and chest pain that began this evening after cath.  EKG shows atrial flutter with 2:1 AV conduction, which is new.  Heparin is currently on hold after his LHC today.  Cath revealed severe, multivessel disease and reduced systolic function.  He will not tolerate tachycarrhythmias well.  Amiodarone bolus and infusion were started and nitroglycerin was increased with symptomatic relief.  If his chest pain recurs, will resume heparin prior to the scheduled time (midnight), given that his cath was radial.  Mark Bixler C. Duke Salviaandolph, MD, [facility]  11/09/2017  6:45 PM

## 2017-11-06 NOTE — Progress Notes (Signed)
Chest pain still 6/10, nitro sl given

## 2017-11-06 NOTE — Progress Notes (Addendum)
Pt started c/o nausea, chest pain radiating to left side of neck and shoulder. 12 lead ekg done, oxygen increased to 6l/min. Notified dr Sharon Sellermcclung and [employer] Corporationlindsay roberts PA. New orders rec'd will come assess pt.

## 2017-11-06 NOTE — H&P (View-Only) (Signed)
Progress Note  Patient Name: Mark Flynn Date of Encounter: 11/20/2017  Primary Cardiologist: Dulce SellarMunley  Subjective   Developed recurrent chest pain this morning. Nitro gtt off? Also short of breath, and nauseated.   Inpatient Medications    Scheduled Meds: . aspirin  81 mg Oral Daily  . atorvastatin  40 mg Oral q1800  . buPROPion  300 mg Oral Daily  . carvedilol  3.125 mg Oral BID WC  . finasteride  5 mg Oral Daily  . FLUoxetine  40 mg Oral Daily  . folic acid  1 mg Oral Daily  . furosemide  40 mg Intravenous BID  . furosemide  40 mg Intravenous Once  . gabapentin  300 mg Oral BID  . insulin aspart  0-9 Units Subcutaneous TID WC  . insulin detemir  12 Units Subcutaneous Daily  . ipratropium-albuterol  3 mL Nebulization QID  . mouth rinse  15 mL Mouth Rinse BID  . multivitamin with minerals  1 tablet Oral Daily  . pantoprazole  40 mg Oral Daily  . ranolazine  500 mg Oral QPM  . tamsulosin  0.4 mg Oral Daily  . terazosin  2 mg Oral QHS  . thiamine  100 mg Oral Daily   Or  . thiamine  100 mg Intravenous Daily  . [START ON 11/08/2017] Vitamin D (Ergocalciferol)  50,000 Units Oral Q7 days   Continuous Infusions: . sodium chloride 75 mL/hr at 11/02/2017 0500  . heparin 1,700 Units/hr (11/20/2017 0500)  . nitroGLYCERIN 10 mcg/min (11/07/2017 0925)   PRN Meds: acetaminophen, albuterol, ALPRAZolam, gi cocktail, LORazepam **OR** LORazepam, morphine injection, nitroGLYCERIN, ondansetron (ZOFRAN) IV   Vital Signs    Vitals:   11/15/2017 0900 11/16/2017 0905 11/01/2017 0916 11/18/2017 0920  BP: 131/81 115/80 121/81 122/73  Pulse: (!) 103 (!) 101 100 (!) 101  Resp: (!) 24 19 20  (!) 21  Temp:      TempSrc:      SpO2: 90% (!) 89% 94% 95%  Weight:      Height:        Intake/Output Summary (Last 24 hours) at 11/29/2017 0936 Last data filed at 11/14/2017 0913 Gross per 24 hour  Intake 991.68 ml  Output 725 ml  Net 266.68 ml   Filed Weights   11/29/2017 0218 11/12/2017 0451  Weight: 225 lb  (102.1 kg) 225 lb (102.1 kg)    Telemetry    SR with LBBB - Personally Reviewed  ECG    ST with LBBB - Personally Reviewed  Physical Exam   General: Ill appearing older W male , diaphoretic, short of breath.   Head: Normocephalic, atraumatic.  Neck: Supple, + JVD. Lungs:  Resp regular, but labored, Diminished with faint crackles. Heart: RRR, S1, S2, no S3, S4, or murmur; no rub. Abdomen: Soft, non-tender, + distended with normoactive bowel sounds.  Extremities: No clubbing, cyanosis, edema. Distal pedal pulses are 2+ bilaterally. Neuro: Alert and oriented X 3. Moves all extremities spontaneously. Psych: Normal affect.  Labs    Chemistry Recent Labs  Lab 11/02/2017 0223 11/15/2017 0025  NA 137 136  K 3.8 3.4*  CL 105 101  CO2 21* 24  GLUCOSE 291* 206*  BUN 32* 32*  CREATININE 1.83* 1.93*  CALCIUM 7.9* 8.0*  GFRNONAA 33* 31*  GFRAA 39* 36*  ANIONGAP 11 11     Hematology Recent Labs  Lab 11/24/2017 0223 11/08/2017 0025  WBC 9.4 9.5  RBC 4.00* 3.81*  HGB 12.2* 11.5*  HCT 36.4* 34.4*  MCV 91.0 90.3  MCH 30.5 30.2  MCHC 33.5 33.4  RDW 14.8 15.1  PLT 144* 134*    Cardiac Enzymes Recent Labs  Lab 11/09/2017 1014 11/03/2017 1557 11/29/2017 1956  TROPONINI 0.39* 0.69* 0.47*    Recent Labs  Lab 11/13/2017 0233 11/13/2017 0641  TROPIPOC 0.09* 0.17*     BNP Recent Labs  Lab 10/31/2017 0430  BNP 410.1*     DDimer No results for input(s): DDIMER in the last 168 hours.    Radiology    Dg Chest 2 View  Result Date: 11/29/2017 CLINICAL DATA:  Acute respiratory failure with hypoxia, shortness of breath. History of COPD, coronary artery disease and previous MI, former smoker. EXAM: CHEST  2 VIEW COMPARISON:  Chest x-ray and chest CT scan of November 05, 2016 FINDINGS: Today's study is obtained in a more lordotic projection than was the earlier study. The pulmonary vascularity is engorged. The interstitial markings are increased and areas of patchy airspace opacity have  developed on the left. There is increased density at the right base likely reflecting subsegmental atelectasis. The cardiac silhouette remains enlarged. The bony structures exhibit no acute abnormalities. IMPRESSION: Worsening of CHF now with interstitial and alveolar edema. Probable right basilar subsegmental atelectasis. Electronically Signed   By: David  [place] M.D.   On: 11/18/2017 07:49   Ct Angio Chest Pe W And/or Wo Contrast  Result Date: 11/28/2017 CLINICAL DATA:  Chest pain.  PE suspected, high pretest prob EXAM: CT ANGIOGRAPHY CHEST WITH CONTRAST TECHNIQUE: Multidetector CT imaging of the chest was performed using the standard protocol during bolus administration of intravenous contrast. Multiplanar CT image reconstructions and MIPs were obtained to evaluate the vascular anatomy. CONTRAST:  65mL ISOVUE-370 IOPAMIDOL (ISOVUE-370) INJECTION 76% COMPARISON:  Chest radiograph earlier this day.  No prior chest CT. FINDINGS: Cardiovascular: There are no filling defects within the pulmonary arteries to suggest pulmonary embolus. Multi chamber cardiomegaly. Thoracic aorta is normal in caliber with minimal atherosclerotic calcifications. There are coronary artery calcifications. No pericardial effusion. Mediastinum/Nodes: Shotty mediastinal and bilateral hilar nodes. The esophagus is decompressed. No thyroid nodule. Lungs/Pleura: Diffuse bilateral ground-glass opacity, favored to be pulmonary edema. Small layering pleural effusions with fluid in the fissures. Smooth septal thickening. Trachea and mainstem bronchi are patent. Punctate calcified granuloma in the right lower lobe. Upper Abdomen: No acute finding. Musculoskeletal: Right shoulder arthroplasty. There are no acute or suspicious osseous abnormalities. Review of the MIP images confirms the above findings. IMPRESSION: 1. No pulmonary embolus. 2. Constellation of findings consistent with CHF with cardiomegaly, pleural effusions and septal thickening.  Diffuse bilateral ground-glass opacities favored to be alveolar edema, less likely considerations include pneumonia or pulmonary hemorrhage. 3. Coronary artery calcifications.  Mild aortic atherosclerosis. Electronically Signed   By: Rubye Oaks M.D.   On: 11/27/2017 04:43   Dg Chest Port 1 View  Result Date: 10/31/2017 CLINICAL DATA:  Chest pain.  Shortness of breath. EXAM: PORTABLE CHEST 1 VIEW COMPARISON:  08/14/2016 FINDINGS: The heart is enlarged, slight progression from prior exam. Mild pulmonary edema. No evidence of pleural effusion. Linear atelectasis or scarring in the right midlung. No confluent airspace disease. No pneumothorax. Right shoulder arthroplasty. IMPRESSION: Cardiomegaly and pulmonary edema. Electronically Signed   By: Rubye Oaks M.D.   On: 10/31/2017 03:17    Cardiac Studies   TTE: 11/09/2017  Study Conclusions  - Left ventricle: apex not well seen cannot r/o mural thrombus. The   cavity size was severely dilated. Wall thickness was increased in  a pattern of mild LVH. The estimated ejection fraction was 20%.   Diffuse hypokinesis. Doppler parameters are consistent with both   elevated ventricular end-diastolic filling pressure and elevated   left atrial filling pressure. - Mitral valve: There was moderate regurgitation. - Left atrium: The atrium was moderately dilated. - Atrial septum: No defect or patent foramen ovale was identified. - Pulmonary arteries: PA peak pressure: 56 mm Hg (S).  Patient Profile     82 y.o. male with PMH of CAD ( CTO of RCA with collaterals, diffuse disease), ICM, HTN, HL, and DM who presented with chest pain and shortness of breath. Found to have positive troponins, and EF of 20%.   Assessment & Plan    1. Chest pain: Presented with sudden onset of chest pain and shortness of breath around 430pm the day prior to admission. Trop peaked at 0.6. Was placed on IV nitro, but somehow stopped. Developed recurrent chest/neck pain about  an hour prior to assessment. Also nauseated, and diaphoretic. Echo shows further decline in EF. Plan was for cath today pending Cr, but slightly worse today. Given his recurrent chest pain/symptoms will need to move forward with cath this morning despite renal function. This was discussed with the patient with attending MD at the bedside. IV nitro restarted and did have improvement in symptoms while at the bedside.   2. ICM: Known EF of 30% with global hypokinesis back in 2016. BNP 400 and CXR with edema. Echo this admission shows EF of 20% with PA pressure of -- will given additional dose of IV lasix as he is dyspneic on exam.  -- on BB, no room for ACE/ARB given Cr  3. HTN: Stable with current therapy  4. HL: was not on home statin. Trig 654, unable to calculate LDL. -- continue lipitor 40mg    5. IDDM: CBGs uncontrolled. Hgb A1c 6.8.  -- SSI while inpatient   6. CKD vs AKI: Cr 1.8>>1.9. Continue to follow with diuresis and plans for cath.   Signed, Laverda Page, NP  11/09/2017, 9:36 AM  Pager # [phone]   For questions or updates, please contact [facility] Please consult [email or web address] for contact info under Cardiology/STEMI.  I have personally seen and examined this patient with Laverda Page, NP. I agree with the assessment and plan as outlined above. He is admitted with a NSTEMI. He is known to have CAD. He has had recurrent pain this am. EKG with chronic LBBB. He has mild volume overload but can lay flat. Chronic kidney disease noted.  We will give IV Lasix this am and place condom cath pre-cath.  Will proceed with cardiac cath given his recurrent chest pain. Will try to limit contrast load given his kidney disease.   He understands the risk of contrast nephropathy with cath.   Verne Carrow 11/04/2017 10:17 AM

## 2017-11-06 NOTE — Plan of Care (Signed)
Continue current care plan 

## 2017-11-06 NOTE — Progress Notes (Signed)
Pt SOB, diaphoretic, skin clammy, Wheezing in bilateral upper lobes, Sat 85% on 10 HFNC. RR in the 30's, No complaints of CP, HOB elevated, RRT called, Breathing TX given with no relief. MD paged. Orders received for Continuous Neb, BIPAP, CXR and EKG. Will continue to monitor.   2251 Update Pt refusing BIPAP,  MD made aware. Will continue to monitor.

## 2017-11-06 NOTE — Progress Notes (Signed)
ANTICOAGULATION CONSULT NOTE Pharmacy Consult for Heparin Indication: chest pain/ACS  Allergies  Allergen Reactions  . Ambien [Zolpidem Tartrate] Other (See Comments)    Drives me crazy  . Codeine Other (See Comments)    unknown    Patient Measurements: Height: 6\' 1"  (185.4 cm) Weight: 224 lb 13.9 oz (102 kg) IBW/kg (Calculated) : 79.9 Heparin Dosing Weight: 100.5 kg  Vital Signs: Temp: 98.3 F (36.8 C) (01/08 1157) Temp Source: Oral (01/08 1157) BP: 117/71 (01/08 1400) Pulse Rate: 102 (01/08 1400)  Labs: Recent Labs    11/16/2017 0223 10/30/2017 0405 11/11/2017 1014 11/27/2017 1417 11/15/2017 1557 11/02/2017 1956 2018/02/25 0025  HGB 12.2*  --   --   --   --   --  11.5*  HCT 36.4*  --   --   --   --   --  34.4*  PLT 144*  --   --   --   --   --  134*  LABPROT  --   --  12.8  --   --   --   --   INR  --   --  0.98  --   --   --   --   HEPARINUNFRC  --  <0.10*  --  <0.10*  --   --  0.24*  CREATININE 1.83*  --   --   --   --   --  1.93*  TROPONINI  --   --  0.39*  --  0.69* 0.47*  --     Estimated Creatinine Clearance: 38.9 mL/min (A) (by C-G formula based on SCr of 1.93 mg/dL (H)).  Assessment: 82 y.o. male with chest pain for heparin. Heparin level this morning came back at 0.24, prompting rate increase. Patient went to cath prior to repeat level being drawn. Cath report found significant multivessel CAD (dist RCA, mid RCA, and mid Cx lesions). Pharmacy consulted to start heparin 10 hours after sheath removed. Sheath was removed on 1/8 at 1400.   Hgb was 11.5, platelets remain stable (134). No signs/symptoms of bleeding.  Goal of Therapy:  Heparin level 0.3-0.7 units/ml Monitor platelets by anticoagulation protocol: Yes   Plan:  Restart heparin infusion at 1700 units/hr at 0000 Order heparin level 8 hours after restart Monitor daily HL and CBC Follow up cardiology plan  Girard CooterKimberly Perkins, PharmD Clinical Pharmacist  Pager: [phone] Clinical Phone for 11/15/2017 until  3:30pm: [phone] If after 3:30pm, please call main pharmacy at [phone] 11/14/2017,2:43 PM

## 2017-11-06 NOTE — Progress Notes (Signed)
Laverda PageLindsay roberts NP at bedside, new orders rec'd to restart nitro gtt., chest was radiating to left side of neck and left side of shoulder. Pain still remains 6/10.

## 2017-11-06 NOTE — Progress Notes (Signed)
[place] TEAM 1 - Stepdown/ICU TEAM  Mark Flynn  ZOX:[id number] DOB: 05-Mar-1936 DOA: 09-Nov-2017 PCP: Anselmo Pickler, MD    Brief Narrative:  82 y.o. male with a hx of COPD, ongoing tobacco abuse, DM, HLD, HTN, CAD status post cardiac cath at [facility] ~2016 (some blockages, not stented, treated medically) who presented to the ED with the acute onset of chest pain while at rest.  He took several sublingual nitroglycerin without relief, as well as aspirin.  The patient also reported dyspnea for 3 months, worse on exertion.    In the ED his EKG noted a L BBB.  Troponin was elevated.  CT angio of the chest was negative for PE.  Significant Events: 1/7 admit   Subjective: The pt has developed recurrent chest pain this morning.  It is associated with shortness of breath as well as a pain in the left side of the neck.  It presently appears to be improving with resumption of his IV nitroglycerin, which was discontinued for unclear reasons apparently during the night.  The patient is mildly short of breath but able to lay flat.  He denies abdominal pain nausea or vomiting at present but was severely nauseated when his pain first came back.  Assessment & Plan:  Chest pain - known CAD Cards following - appears to be relatively unstable - agree w/ urgent cardiac cath   Acute severe systolic CHF  TTE notes EF 20% - cont to diurese - clearly volume overloaded on exam - baseline weight unknown - follow Is/Os   Filed Weights   11-09-2017 0218 11/22/2017 0451  Weight: 102.1 kg (225 lb) 102.1 kg (225 lb)    Acute hypoxic resp failure - pulmonary edema  Only requiring moderate oxygen support at present -diurese and follow  CKD  Baseline crt ~1.5 as of une 2017  COPD -tobacco abuse Well compensated at present  HTN Reasonably controlled at this time  DM Follow CBGs and adjust treatment as required  HLD Continue medical treatment  DVT prophylaxis: heparin gtt Code Status: NO CODE -  DNR Family Communication: no family present at time of exam  Disposition Plan:   Consultants:  Cardiology   Antimicrobials:  none  Objective: Blood pressure 115/80, pulse (!) 101, temperature 98.8 F (37.1 C), temperature source Oral, resp. rate 19, height 6\' 1"  (1.854 m), weight 102.1 kg (225 lb), SpO2 (!) 89 %.  Intake/Output Summary (Last 24 hours) at 11/16/2017 0912 Last data filed at 11/09/2017 0800 Gross per 24 hour  Intake 855.9 ml  Output 725 ml  Net 130.9 ml   Filed Weights   11/09/2017 0218 11/12/2017 0451  Weight: 102.1 kg (225 lb) 102.1 kg (225 lb)    Examination: General: No acute respiratory distress Lungs: Bilateral diffuse crackles with no wheezing Cardiovascular: Regular rate and rhythm without murmur  Abdomen: Nontender, protuberant, soft, bowel sounds positive, no rebound, no ascites, no appreciable mass Extremities: 1+ bilateral lower extremity edema  CBC: Recent Labs  Lab Nov 09, 2017 0223 11/28/2017 0025  WBC 9.4 9.5  HGB 12.2* 11.5*  HCT 36.4* 34.4*  MCV 91.0 90.3  PLT 144* 134*   Basic Metabolic Panel: Recent Labs  Lab 2017-11-09 0223 11/09/2017 0025  NA 137 136  K 3.8 3.4*  CL 105 101  CO2 21* 24  GLUCOSE 291* 206*  BUN 32* 32*  CREATININE 1.83* 1.93*  CALCIUM 7.9* 8.0*   GFR: Estimated Creatinine Clearance: 39 mL/min (A) (by C-G formula based on SCr of 1.93  mg/dL (H)).  Liver Function Tests: No results for input(s): AST, ALT, ALKPHOS, BILITOT, PROT, ALBUMIN in the last 168 hours.  Coagulation Profile: Recent Labs  Lab 06-08-18 1014  INR 0.98    Cardiac Enzymes: Recent Labs  Lab 06-08-18 1014 06-08-18 1557 06-08-18 1956  TROPONINI 0.39* 0.69* 0.47*    HbA1C: Hgb A1c MFr Bld  Date/Time Value Ref Range Status  13-Jul-2018 10:13 AM 6.8 (H) 4.8 - 5.6 % Final    Comment:    (NOTE) Pre diabetes:          5.7%-6.4% Diabetes:              >6.4% Glycemic control for   <7.0% adults with diabetes     CBG: Recent Labs  Lab  06-08-18 0915 06-08-18 1241 06-08-18 2142 06-08-18 2207 10/30/2017 0818  GLUCAP 234* 188* 159* 168* 215*    Recent Results (from the past 240 hour(s))  MRSA PCR Screening     Status: None   Collection Time: 06-08-18  3:20 PM  Result Value Ref Range Status   MRSA by PCR NEGATIVE NEGATIVE Final    Comment:        The GeneXpert MRSA Assay (FDA approved for NASAL specimens only), is one component of a comprehensive MRSA colonization surveillance program. It is not intended to diagnose MRSA infection nor to guide or monitor treatment for MRSA infections.      Scheduled Meds: . nitroGLYCERIN      . aspirin  81 mg Oral Daily  . atorvastatin  40 mg Oral q1800  . buPROPion  300 mg Oral Daily  . carvedilol  3.125 mg Oral BID WC  . finasteride  5 mg Oral Daily  . FLUoxetine  40 mg Oral Daily  . folic acid  1 mg Oral Daily  . furosemide  40 mg Intravenous BID  . gabapentin  300 mg Oral BID  . insulin aspart  0-9 Units Subcutaneous TID WC  . insulin detemir  12 Units Subcutaneous Daily  . ipratropium-albuterol  3 mL Nebulization QID  . mouth rinse  15 mL Mouth Rinse BID  . multivitamin with minerals  1 tablet Oral Daily  . pantoprazole  40 mg Oral Daily  . ranolazine  500 mg Oral QPM  . tamsulosin  0.4 mg Oral Daily  . terazosin  2 mg Oral QHS  . thiamine  100 mg Oral Daily   Or  . thiamine  100 mg Intravenous Daily  . [START ON 11/08/2017] Vitamin D (Ergocalciferol)  50,000 Units Oral Q7 days   Continuous Infusions: . sodium chloride 75 mL/hr at 11/23/2017 0500  . heparin 1,700 Units/hr (11/04/2017 0500)     LOS: 1 day   Lonia BloodJeffrey T. McClung, MD [facility] Office  [phone] Pager - Text Page per Amion as per below:  On-Call/Text Page:      [email or web address]      password [email or web address]  If 7PM-7AM, please contact night-coverage [email or web address] Password [email or web address] 11/28/2017, 9:12 AM

## 2017-11-06 NOTE — Progress Notes (Signed)
ANTICOAGULATION CONSULT NOTE Pharmacy Consult for Heparin Indication: chest pain/ACS  Allergies  Allergen Reactions  . Ambien [Zolpidem Tartrate] Other (See Comments)    Drives me crazy  . Codeine Other (See Comments)    unknown    Patient Measurements: Height: 6\' 1"  (185.4 cm) Weight: 225 lb (102.1 kg) IBW/kg (Calculated) : 79.9 Heparin Dosing Weight: 100.5 kg  Vital Signs: Temp: 98.1 F (36.7 C) (01/07 2317) Temp Source: Oral (01/07 2317) BP: 121/77 (01/07 2052) Pulse Rate: 98 (01/07 2052)  Labs: Recent Labs    Aug 16, 2018 0223 Aug 16, 2018 0405 Aug 16, 2018 1014 Aug 16, 2018 1417 Aug 16, 2018 1557 Aug 16, 2018 1956 11/24/2017 0025  HGB 12.2*  --   --   --   --   --  11.5*  HCT 36.4*  --   --   --   --   --  34.4*  PLT 144*  --   --   --   --   --  134*  LABPROT  --   --  12.8  --   --   --   --   INR  --   --  0.98  --   --   --   --   HEPARINUNFRC  --  <0.10*  --  <0.10*  --   --  0.24*  CREATININE 1.83*  --   --   --   --   --   --   TROPONINI  --   --  0.39*  --  0.69* 0.47*  --     Estimated Creatinine Clearance: 41.1 mL/min (A) (by C-G formula based on SCr of 1.83 mg/dL (H)).  Assessment: 82 y.o. male with chest pain for heparin.   Goal of Therapy:  Heparin level 0.3-0.7 units/ml Monitor platelets by anticoagulation protocol: Yes   Plan:  Increase Heparin 1700 units/hr Follow up after cath today   Geannie RisenGreg Annalis Kaczmarczyk, PharmD, BCPS   11/07/2017,1:03 AM

## 2017-11-06 NOTE — Progress Notes (Signed)
Nitro gtt infusing, pain subsiding to left side of face.

## 2017-11-06 NOTE — Progress Notes (Signed)
Progress Note  Patient Name: Mark Flynn Date of Encounter: 11/20/2017  Primary Cardiologist: Dulce SellarMunley  Subjective   Developed recurrent chest pain this morning. Nitro gtt off? Also short of breath, and nauseated.   Inpatient Medications    Scheduled Meds: . aspirin  81 mg Oral Daily  . atorvastatin  40 mg Oral q1800  . buPROPion  300 mg Oral Daily  . carvedilol  3.125 mg Oral BID WC  . finasteride  5 mg Oral Daily  . FLUoxetine  40 mg Oral Daily  . folic acid  1 mg Oral Daily  . furosemide  40 mg Intravenous BID  . furosemide  40 mg Intravenous Once  . gabapentin  300 mg Oral BID  . insulin aspart  0-9 Units Subcutaneous TID WC  . insulin detemir  12 Units Subcutaneous Daily  . ipratropium-albuterol  3 mL Nebulization QID  . mouth rinse  15 mL Mouth Rinse BID  . multivitamin with minerals  1 tablet Oral Daily  . pantoprazole  40 mg Oral Daily  . ranolazine  500 mg Oral QPM  . tamsulosin  0.4 mg Oral Daily  . terazosin  2 mg Oral QHS  . thiamine  100 mg Oral Daily   Or  . thiamine  100 mg Intravenous Daily  . [START ON 11/08/2017] Vitamin D (Ergocalciferol)  50,000 Units Oral Q7 days   Continuous Infusions: . sodium chloride 75 mL/hr at 11/16/2017 0500  . heparin 1,700 Units/hr (11/20/2017 0500)  . nitroGLYCERIN 10 mcg/min (11/07/2017 0925)   PRN Meds: acetaminophen, albuterol, ALPRAZolam, gi cocktail, LORazepam **OR** LORazepam, morphine injection, nitroGLYCERIN, ondansetron (ZOFRAN) IV   Vital Signs    Vitals:   11/15/2017 0900 11/16/2017 0905 11/01/2017 0916 11/18/2017 0920  BP: 131/81 115/80 121/81 122/73  Pulse: (!) 103 (!) 101 100 (!) 101  Resp: (!) 24 19 20  (!) 21  Temp:      TempSrc:      SpO2: 90% (!) 89% 94% 95%  Weight:      Height:        Intake/Output Summary (Last 24 hours) at 11/29/2017 0936 Last data filed at 11/14/2017 0913 Gross per 24 hour  Intake 991.68 ml  Output 725 ml  Net 266.68 ml   Filed Weights   11/29/2017 0218 11/12/2017 0451  Weight: 225 lb  (102.1 kg) 225 lb (102.1 kg)    Telemetry    SR with LBBB - Personally Reviewed  ECG    ST with LBBB - Personally Reviewed  Physical Exam   General: Ill appearing older W male , diaphoretic, short of breath.   Head: Normocephalic, atraumatic.  Neck: Supple, + JVD. Lungs:  Resp regular, but labored, Diminished with faint crackles. Heart: RRR, S1, S2, no S3, S4, or murmur; no rub. Abdomen: Soft, non-tender, + distended with normoactive bowel sounds.  Extremities: No clubbing, cyanosis, edema. Distal pedal pulses are 2+ bilaterally. Neuro: Alert and oriented X 3. Moves all extremities spontaneously. Psych: Normal affect.  Labs    Chemistry Recent Labs  Lab 11/02/2017 0223 11/15/2017 0025  NA 137 136  K 3.8 3.4*  CL 105 101  CO2 21* 24  GLUCOSE 291* 206*  BUN 32* 32*  CREATININE 1.83* 1.93*  CALCIUM 7.9* 8.0*  GFRNONAA 33* 31*  GFRAA 39* 36*  ANIONGAP 11 11     Hematology Recent Labs  Lab 11/24/2017 0223 11/08/2017 0025  WBC 9.4 9.5  RBC 4.00* 3.81*  HGB 12.2* 11.5*  HCT 36.4* 34.4*  MCV 91.0 90.3  MCH 30.5 30.2  MCHC 33.5 33.4  RDW 14.8 15.1  PLT 144* 134*    Cardiac Enzymes Recent Labs  Lab 11/11/2017 1014 11/16/2017 1557 11/14/2017 1956  TROPONINI 0.39* 0.69* 0.47*    Recent Labs  Lab 11/08/2017 0233 11/11/2017 0641  TROPIPOC 0.09* 0.17*     BNP Recent Labs  Lab 11/18/2017 0430  BNP 410.1*     DDimer No results for input(s): DDIMER in the last 168 hours.    Radiology    Dg Chest 2 View  Result Date: 11/05/2017 CLINICAL DATA:  Acute respiratory failure with hypoxia, shortness of breath. History of COPD, coronary artery disease and previous MI, former smoker. EXAM: CHEST  2 VIEW COMPARISON:  Chest x-ray and chest CT scan of November 05, 2016 FINDINGS: Today's study is obtained in a more lordotic projection than was the earlier study. The pulmonary vascularity is engorged. The interstitial markings are increased and areas of patchy airspace opacity have  developed on the left. There is increased density at the right base likely reflecting subsegmental atelectasis. The cardiac silhouette remains enlarged. The bony structures exhibit no acute abnormalities. IMPRESSION: Worsening of CHF now with interstitial and alveolar edema. Probable right basilar subsegmental atelectasis. Electronically Signed   By: David  [place] M.D.   On: 11/29/2017 07:49   Ct Angio Chest Pe W And/or Wo Contrast  Result Date: 11/07/2017 CLINICAL DATA:  Chest pain.  PE suspected, high pretest prob EXAM: CT ANGIOGRAPHY CHEST WITH CONTRAST TECHNIQUE: Multidetector CT imaging of the chest was performed using the standard protocol during bolus administration of intravenous contrast. Multiplanar CT image reconstructions and MIPs were obtained to evaluate the vascular anatomy. CONTRAST:  65mL ISOVUE-370 IOPAMIDOL (ISOVUE-370) INJECTION 76% COMPARISON:  Chest radiograph earlier this day.  No prior chest CT. FINDINGS: Cardiovascular: There are no filling defects within the pulmonary arteries to suggest pulmonary embolus. Multi chamber cardiomegaly. Thoracic aorta is normal in caliber with minimal atherosclerotic calcifications. There are coronary artery calcifications. No pericardial effusion. Mediastinum/Nodes: Shotty mediastinal and bilateral hilar nodes. The esophagus is decompressed. No thyroid nodule. Lungs/Pleura: Diffuse bilateral ground-glass opacity, favored to be pulmonary edema. Small layering pleural effusions with fluid in the fissures. Smooth septal thickening. Trachea and mainstem bronchi are patent. Punctate calcified granuloma in the right lower lobe. Upper Abdomen: No acute finding. Musculoskeletal: Right shoulder arthroplasty. There are no acute or suspicious osseous abnormalities. Review of the MIP images confirms the above findings. IMPRESSION: 1. No pulmonary embolus. 2. Constellation of findings consistent with CHF with cardiomegaly, pleural effusions and septal thickening.  Diffuse bilateral ground-glass opacities favored to be alveolar edema, less likely considerations include pneumonia or pulmonary hemorrhage. 3. Coronary artery calcifications.  Mild aortic atherosclerosis. Electronically Signed   By: Rubye Oaks M.D.   On: 11/26/2017 04:43   Dg Chest Port 1 View  Result Date: 11/26/2017 CLINICAL DATA:  Chest pain.  Shortness of breath. EXAM: PORTABLE CHEST 1 VIEW COMPARISON:  08/14/2016 FINDINGS: The heart is enlarged, slight progression from prior exam. Mild pulmonary edema. No evidence of pleural effusion. Linear atelectasis or scarring in the right midlung. No confluent airspace disease. No pneumothorax. Right shoulder arthroplasty. IMPRESSION: Cardiomegaly and pulmonary edema. Electronically Signed   By: Rubye Oaks M.D.   On: 11/07/2017 03:17    Cardiac Studies   TTE: 11/12/2017  Study Conclusions  - Left ventricle: apex not well seen cannot r/o mural thrombus. The   cavity size was severely dilated. Wall thickness was increased in  a pattern of mild LVH. The estimated ejection fraction was 20%.   Diffuse hypokinesis. Doppler parameters are consistent with both   elevated ventricular end-diastolic filling pressure and elevated   left atrial filling pressure. - Mitral valve: There was moderate regurgitation. - Left atrium: The atrium was moderately dilated. - Atrial septum: No defect or patent foramen ovale was identified. - Pulmonary arteries: PA peak pressure: 56 mm Hg (S).  Patient Profile     82 y.o. male with PMH of CAD ( CTO of RCA with collaterals, diffuse disease), ICM, HTN, HL, and DM who presented with chest pain and shortness of breath. Found to have positive troponins, and EF of 20%.   Assessment & Plan    1. Chest pain: Presented with sudden onset of chest pain and shortness of breath around 430pm the day prior to admission. Trop peaked at 0.6. Was placed on IV nitro, but somehow stopped. Developed recurrent chest/neck pain about  an hour prior to assessment. Also nauseated, and diaphoretic. Echo shows further decline in EF. Plan was for cath today pending Cr, but slightly worse today. Given his recurrent chest pain/symptoms will need to move forward with cath this morning despite renal function. This was discussed with the patient with attending MD at the bedside. IV nitro restarted and did have improvement in symptoms while at the bedside.   2. ICM: Known EF of 30% with global hypokinesis back in 2016. BNP 400 and CXR with edema. Echo this admission shows EF of 20% with PA pressure of -- will given additional dose of IV lasix as he is dyspneic on exam.  -- on BB, no room for ACE/ARB given Cr  3. HTN: Stable with current therapy  4. HL: was not on home statin. Trig 654, unable to calculate LDL. -- continue lipitor 40mg    5. IDDM: CBGs uncontrolled. Hgb A1c 6.8.  -- SSI while inpatient   6. CKD vs AKI: Cr 1.8>>1.9. Continue to follow with diuresis and plans for cath.   Signed, Laverda Page, NP  11/23/2017, 9:36 AM  Pager # [phone]   For questions or updates, please contact [facility] Please consult [email or web address] for contact info under Cardiology/STEMI.  I have personally seen and examined this patient with Laverda Page, NP. I agree with the assessment and plan as outlined above. He is admitted with a NSTEMI. He is known to have CAD. He has had recurrent pain this am. EKG with chronic LBBB. He has mild volume overload but can lay flat. Chronic kidney disease noted.  We will give IV Lasix this am and place condom cath pre-cath.  Will proceed with cardiac cath given his recurrent chest pain. Will try to limit contrast load given his kidney disease.   He understands the risk of contrast nephropathy with cath.   Verne Carrow 11/09/2017 10:17 AM

## 2017-11-06 NOTE — Care Management Note (Signed)
Case Management Note  Patient Details  Name: Mark Flynn MRN: [id number] Date of Birth: 1936-07-08  Subjective/Objective:       Pt admitted with CP and SOB             Action/Plan:  Pt is from home and independent with ADLs.  Pt requiring Nitro drip with sustained CP - plan for urgent  cardiac cath 11/03/2017.  CM will continue to follow for discharge needs   Expected Discharge Date:                  Expected Discharge Plan:     In-House Referral:     Discharge planning Services  CM Consult  Post Acute Care Choice:    Choice offered to:     DME Arranged:    DME Agency:     HH Arranged:    HH Agency:     Status of Service:     If discussed at [employer] Meetings, dates discussed:    Additional Comments:  Cherylann ParrClaxton, Jenney Brester S, RN 11/16/2017, 2:11 PM

## 2017-11-06 NOTE — Interval H&P Note (Signed)
Cath Lab Visit (complete for each Cath Lab visit)  Clinical Evaluation Leading to the Procedure:   ACS: Yes.    Non-ACS:    Anginal Classification: CCS IV  Anti-ischemic medical therapy: Maximal Therapy (2 or more classes of medications)  Non-Invasive Test Results: No non-invasive testing performed  Prior CABG: No previous CABG      History and Physical Interval Note:  11/15/2017 10:43 AM  Mark Flynn  has presented today for surgery, with the diagnosis of cp  The various methods of treatment have been discussed with the patient and family. After consideration of risks, benefits and other options for treatment, the patient has consented to  Procedure(s): LEFT HEART CATH AND CORONARY ANGIOGRAPHY (N/A) as a surgical intervention .  The patient's history has been reviewed, patient examined, no change in status, stable for surgery.  I have reviewed the patient's chart and labs.  Questions were answered to the patient's satisfaction.     Nicki Guadalajarahomas Vauda Salvucci

## 2017-11-06 NOTE — Progress Notes (Signed)
Pt c/o nausea again, c/o sob increased HFNC from 8L/min to 10L/min. Also c/o chest pain, increased nitro gtt to 2215mcg/min. 12 lead ekg done, paged dr Sharon Sellermcclung and dr Duke Salviarandolph regarding this matter. Dr Duke Salviarandolph will come see pt.

## 2017-11-07 DIAGNOSIS — I5043 Acute on chronic combined systolic (congestive) and diastolic (congestive) heart failure: Secondary | ICD-10-CM

## 2017-11-07 LAB — GLUCOSE, CAPILLARY
GLUCOSE-CAPILLARY: 277 mg/dL — AB (ref 65–99)
GLUCOSE-CAPILLARY: 218 mg/dL — AB (ref 65–99)
GLUCOSE-CAPILLARY: 190 mg/dL — AB (ref 65–99)
Glucose-Capillary: 214 mg/dL — ABNORMAL HIGH (ref 65–99)

## 2017-11-07 LAB — CBC
HEMATOCRIT: 31.8 % — AB (ref 39.0–52.0)
HEMOGLOBIN: 10.4 g/dL — AB (ref 13.0–17.0)
MCH: 30.1 pg (ref 26.0–34.0)
MCHC: 32.7 g/dL (ref 30.0–36.0)
MCV: 91.9 fL (ref 78.0–100.0)
Platelets: 127 10*3/uL — ABNORMAL LOW (ref 150–400)
RBC: 3.46 MIL/uL — AB (ref 4.22–5.81)
RDW: 15.3 % (ref 11.5–15.5)
WBC: 9.1 10*3/uL (ref 4.0–10.5)

## 2017-11-07 LAB — COMPREHENSIVE METABOLIC PANEL
ALT: 20 U/L (ref 17–63)
AST: 28 U/L (ref 15–41)
Albumin: 3.6 g/dL (ref 3.5–5.0)
Alkaline Phosphatase: 83 U/L (ref 38–126)
Anion gap: 15 (ref 5–15)
BILIRUBIN TOTAL: 1.1 mg/dL (ref 0.3–1.2)
BUN: 44 mg/dL — AB (ref 6–20)
CO2: 24 mmol/L (ref 22–32)
CREATININE: 2.83 mg/dL — AB (ref 0.61–1.24)
Calcium: 8.1 mg/dL — ABNORMAL LOW (ref 8.9–10.3)
Chloride: 94 mmol/L — ABNORMAL LOW (ref 101–111)
GFR calc Af Amer: 23 mL/min — ABNORMAL LOW (ref >60)
GFR, EST NON AFRICAN AMERICAN: 20 mL/min — AB (ref >60)
Glucose, Bld: 271 mg/dL — ABNORMAL HIGH (ref 65–99)
Potassium: 4 mmol/L (ref 3.5–5.1)
Sodium: 133 mmol/L — ABNORMAL LOW (ref 135–145)
Total Protein: 6.9 g/dL (ref 6.5–8.1)

## 2017-11-07 LAB — MAGNESIUM: Magnesium: 2 mg/dL (ref 1.7–2.4)

## 2017-11-07 LAB — HEPARIN LEVEL (UNFRACTIONATED)
HEPARIN UNFRACTIONATED: 0.53 [IU]/mL (ref 0.30–0.70)
Heparin Unfractionated: 0.21 IU/mL — ABNORMAL LOW (ref 0.30–0.70)

## 2017-11-07 MED ORDER — FUROSEMIDE 10 MG/ML IJ SOLN
80.0000 mg | Freq: Three times a day (TID) | INTRAMUSCULAR | Status: DC
  Administered 2017-11-07 – 2017-11-08 (×3): 80 mg via INTRAVENOUS
  Filled 2017-11-07 (×3): qty 8

## 2017-11-07 MED ORDER — MORPHINE SULFATE (PF) 2 MG/ML IV SOLN: 1.0000 mg | INTRAVENOUS | Status: DC | PRN

## 2017-11-07 MED ORDER — MORPHINE SULFATE (PF) 2 MG/ML IV SOLN
1.0000 mg | INTRAVENOUS | Status: DC | PRN
  Administered 2017-11-08: 1 mg via INTRAVENOUS
  Administered 2017-11-08: 3 mg via INTRAVENOUS
  Filled 2017-11-07: qty 2
  Filled 2017-11-07 (×2): qty 1

## 2017-11-07 MED ORDER — DIAZEPAM 5 MG PO TABS
5.0000 mg | ORAL_TABLET | Freq: Four times a day (QID) | ORAL | Status: DC | PRN
  Administered 2017-11-07: 5 mg via ORAL
  Filled 2017-11-07: qty 1

## 2017-11-07 MED ORDER — IPRATROPIUM-ALBUTEROL 0.5-2.5 (3) MG/3ML IN SOLN
3.0000 mL | Freq: Three times a day (TID) | RESPIRATORY_TRACT | Status: DC
  Administered 2017-11-07 – 2017-11-08 (×5): 3 mL via RESPIRATORY_TRACT
  Filled 2017-11-07 (×5): qty 3

## 2017-11-07 MED ORDER — IPRATROPIUM-ALBUTEROL 0.5-2.5 (3) MG/3ML IN SOLN: 3.0000 mL | Freq: Three times a day (TID) | RESPIRATORY_TRACT | Status: DC

## 2017-11-07 MED ORDER — ATORVASTATIN CALCIUM 80 MG PO TABS
80.0000 mg | ORAL_TABLET | Freq: Every day | ORAL | Status: DC
  Administered 2017-11-07 – 2017-11-08 (×2): 80 mg via ORAL
  Filled 2017-11-07 (×2): qty 1

## 2017-11-07 MED ORDER — LORAZEPAM 2 MG/ML IJ SOLN
2.0000 mg | INTRAMUSCULAR | Status: DC | PRN
  Administered 2017-11-07: 2 mg via INTRAVENOUS
  Filled 2017-11-07: qty 1

## 2017-11-07 MED ORDER — AMIODARONE HCL IN DEXTROSE 360-4.14 MG/200ML-% IV SOLN
30.0000 mg/h | INTRAVENOUS | Status: DC
  Administered 2017-11-07 – 2017-11-08 (×4): 30 mg/h via INTRAVENOUS
  Filled 2017-11-07 (×4): qty 200

## 2017-11-07 MED ORDER — AMIODARONE HCL IN DEXTROSE 360-4.14 MG/200ML-% IV SOLN: 30.0000 mg/h | INTRAVENOUS | Status: DC

## 2017-11-07 NOTE — Progress Notes (Signed)
RN paged NP about 2230 1/8//19 that pt was diaphoretic, clammy and had wheezing upper lobes and some audibly as well. RT came to see pt and suggested 1 hour neb, CXR, Bipap was was ordered and NP to bedside.   Brief HPI: ED on 1/7 with CP at rest without relief with home meds. CT chest neg for PE. Cards consulted-NSTEMI. Trop peak at 0.6 and trended downward. EKG changes with chronic LBBB and 2: 1 AV conduction and Aflutter, so pt started on Amio. CP had stopped but on 1/8, he had recurrent CP, so pt had card cath showing increased systolic dysfunction and severe multivessel disease. Heparin restarted post cath tonight per cards order. Ongoing tobacco abuse with COPD, controlled on admit. Hx CAD with previous cath 2016, being tx'd medically only. Old TEE showed EF 20% with mild pulmonary edema on CXR. On Lasix. He is on Lipitor, ASA 81, and meds currently on MAR. He is not on a BB, I suspect due to COPD.   S: feels SOB, refused Bipap, not wanting to wear the neb either. No CP at present.  O: chronically ill appearing, slightly ornery, elderly WM in mild respiratory distress. Some belly breathing but not working hard to breath at present.(less now).  Audible wheezing and breath sounds with wheezing in upper respiratory area. RR upper 20s. BP 1 teens to 120s. HR 78-100. O2 sat was 87% at one point and he was bumped to HFNC prior to treatments below. Then able to wean back to 2L with O2 sat of 98%.No cyanosis.  A/P: 1. Acute respiratory distress secondary to CHF decompensation and COPD exacerbation. Much improved after extra Lasix 40mg , Solumedrol 40mg  and one hour Neb (he took this off after about 40 minutes). CXR same as before. Had good UO after Lasix. Rechecked on pt at 0230 and he was resting quietly without audible wheezing. VSS.  2. CODE STATUS-NP had a talk with pt and he said he never wants to be "coded", have CPR, or be on a breathing machine. NP explained that if he didn't want that AND he refused  Bipap if we needed to use it, that we would be out of options. He was at peace with his decision. Son and male visitor at bedside agree with his decision.  3. CP-was about 1-2 at time of visit. Has NTG and Heparin gtt restart s/p cath. Cards following. Sleeping later without c/o.   He may require more nebs and more Solumedrol. Stable now, will defer to attending.   Total critical care time: 150 minutes Critical care time was exclusive of separately billable procedures and treating other patients. Critical care was necessary to treat or prevent imminent or life-threatening deterioration. Critical care was time spent personally by me on the following activities: development of treatment plan with patient and/or surrogate as well as nursing, discussions with consultants, evaluation of patient's response to treatment, examination of patient, obtaining history from patient or surrogate, ordering and performing treatments and interventions, ordering and review of laboratory studies, ordering and review of radiographic studies, pulse oximetry and re-evaluation of patient's condition. KJKG, NP [facility]

## 2017-11-07 NOTE — Progress Notes (Signed)
ANTICOAGULATION CONSULT NOTE - Follow Up Consult  Pharmacy Consult for Heparin Indication: chest pain/ACS  Allergies  Allergen Reactions  . Ambien [Zolpidem Tartrate] Other (See Comments)    Drives me crazy  . Codeine Other (See Comments)    unknown    Patient Measurements: Height: 6\' 1"  (185.4 cm) Weight: 225 lb 15.5 oz (102.5 kg) IBW/kg (Calculated) : 79.9 Heparin Dosing Weight: 100 kg  Vital Signs: Temp: 98.1 F (36.7 C) (01/09 1210) Temp Source: Oral (01/09 1210) BP: 111/84 (01/09 1200) Pulse Rate: 92 (01/09 1210)  Labs: Recent Labs    11/19/2017 0223  11/14/2017 1014 11/02/2017 1417 11/27/2017 1557 11/15/2017 1956 Dec 27, 2017 0025 11/07/17 0846  HGB 12.2*  --   --   --   --   --  11.5* 10.4*  HCT 36.4*  --   --   --   --   --  34.4* 31.8*  PLT 144*  --   --   --   --   --  134* 127*  LABPROT  --   --  12.8  --   --   --   --   --   INR  --   --  0.98  --   --   --   --   --   HEPARINUNFRC  --    < >  --  <0.10*  --   --  0.24* 0.21*  CREATININE 1.83*  --   --   --   --   --  1.93* 2.83*  TROPONINI  --   --  0.39*  --  0.69* 0.47*  --   --    < > = values in this interval not displayed.    Estimated Creatinine Clearance: 26.6 mL/min (A) (by C-G formula based on SCr of 2.83 mg/dL (H)).  Assessment:   Heparin resumed ~12am today, about 10 hrs after cath on 1/8.   Heparin level is subtherapeutic (0.21) on 1700 units/hr.  CBC trending down some, platelet count 144>>127.  Minor oozing at IV site when he repositioned in bed, but RN was able to save IV site, redressed.  Creatinine rising.  Goal of Therapy:  Heparin level 0.3-0.7 units/ml Monitor platelets by anticoagulation protocol: Yes   Plan:   Increase heparin drip to 1900 units/hr  Heparin level ~8 hrs after rate change.  Daily heparin level and CBC while on heparin.  Follow up plans.  Dennie Fettersgan, Nicodemus Denk Donovan, [place] Pager: [phone] 11/07/2017,12:16 PM

## 2017-11-07 NOTE — Progress Notes (Signed)
ANTICOAGULATION CONSULT NOTE - Follow Up Consult  Pharmacy Consult for Heparin Indication: chest pain/ACS  Allergies  Allergen Reactions  . Ambien [Zolpidem Tartrate] Other (See Comments)    Drives me crazy  . Codeine Other (See Comments)    unknown    Patient Measurements: Height: 6\' 1"  (185.4 cm) Weight: 225 lb 15.5 oz (102.5 kg) IBW/kg (Calculated) : 79.9 Heparin Dosing Weight: 100 kg  Vital Signs: Temp: 98.2 F (36.8 C) (01/09 1950) Temp Source: Oral (01/09 1950) BP: 93/81 (01/09 1950) Pulse Rate: 86 (01/09 1950)  Labs: Recent Labs    11/15/2017 0223  11/08/2017 1014  11/25/2017 1557 11/02/2017 1956 11/05/2017 0025 11/07/17 0846 11/07/17 1959  HGB 12.2*  --   --   --   --   --  11.5* 10.4*  --   HCT 36.4*  --   --   --   --   --  34.4* 31.8*  --   PLT 144*  --   --   --   --   --  134* 127*  --   LABPROT  --   --  12.8  --   --   --   --   --   --   INR  --   --  0.98  --   --   --   --   --   --   HEPARINUNFRC  --    < >  --    < >  --   --  0.24* 0.21* 0.53  CREATININE 1.83*  --   --   --   --   --  1.93* 2.83*  --   TROPONINI  --   --  0.39*  --  0.69* 0.47*  --   --   --    < > = values in this interval not displayed.    Estimated Creatinine Clearance: 26.6 mL/min (A) (by C-G formula based on SCr of 2.83 mg/dL (H)).  Assessment:   Heparin resumed ~12am, about 10 hrs after cath on 1/8.   Heparin level is now therapeutic (0.53) after increase to 1900 units/hr. No bleeding noted.  Goal of Therapy:  Heparin level 0.3-0.7 units/ml Monitor platelets by anticoagulation protocol: Yes   Plan:   Continue heparin drip to 1900 units/hr  Daily heparin level and CBC while on heparin.  Follow up plans.   Thank you for allowing us to participate in this patients care.  Signe Coltonya C Shawnya Mayor, PharmD Clinical phone for 11/07/2017: [phone] If after 10:30p, please call main pharmacy at: [phone] 11/07/2017 8:56 PM

## 2017-11-07 NOTE — Progress Notes (Addendum)
Pt requesting to stop continues neb, stated he didn't need it anymore and he felt a little better. Pt received about 3/4 of the treatment. RRT and MD made aware. Will continue to monitor.

## 2017-11-07 NOTE — Progress Notes (Signed)
Progress Note  Patient Name: Mark Flynn Date of Encounter: 11/07/2017  Primary Cardiologist: Dulce Sellar  Subjective   Had a rough night, became short of breath. Attempted bipap but unable to tolerate. Developed atrial flutter. Had chest pain again this morning.   Inpatient Medications    Scheduled Meds: . aspirin  81 mg Oral Daily  . atorvastatin  80 mg Oral q1800  . buPROPion  300 mg Oral Daily  . carvedilol  3.125 mg Oral BID WC  . finasteride  5 mg Oral Daily  . FLUoxetine  40 mg Oral Daily  . folic acid  1 mg Oral Daily  . furosemide  60 mg Intravenous BID  . gabapentin  300 mg Oral BID  . insulin aspart  0-9 Units Subcutaneous TID WC  . insulin detemir  12 Units Subcutaneous QHS  . ipratropium-albuterol  3 mL Nebulization TID  . mouth rinse  15 mL Mouth Rinse BID  . multivitamin with minerals  1 tablet Oral Daily  . pantoprazole  40 mg Oral Daily  . ranolazine  500 mg Oral QPM  . sodium chloride flush  3 mL Intravenous Q12H  . tamsulosin  0.4 mg Oral Daily  . terazosin  2 mg Oral QHS  . thiamine  100 mg Oral Daily  . [START ON 11/08/2017] Vitamin D (Ergocalciferol)  50,000 Units Oral Q7 days   Continuous Infusions: . sodium chloride 10 mL/hr at 11-12-2017 0943  . sodium chloride    . amiodarone 30 mg/hr (11/07/17 0837)  . heparin 1,700 Units/hr (11/07/17 0002)  . nitroGLYCERIN 15 mcg/min (2017/11/12 1833)   PRN Meds: sodium chloride, acetaminophen, albuterol, gi cocktail, LORazepam, morphine injection, nitroGLYCERIN, ondansetron (ZOFRAN) IV, promethazine, sodium chloride flush   Vital Signs    Vitals:   11/07/17 0400 11/07/17 0430 11/07/17 0500 11/07/17 0800  BP: 131/68 134/78  105/73  Pulse: 94 94  98  Resp: (!) 25 (!) 23  19  Temp:    98 F (36.7 C)  TempSrc:      SpO2: 92% 92%  93%  Weight:   225 lb 15.5 oz (102.5 kg)   Height:        Intake/Output Summary (Last 24 hours) at 11/07/2017 1035 Last data filed at 11/07/2017 1610 Gross per 24 hour  Intake  1220.93 ml  Output 1325 ml  Net -104.07 ml   Filed Weights   11-12-2017 0451 12-Nov-2017 1008 11/07/17 0500  Weight: 225 lb (102.1 kg) 224 lb 13.9 oz (102 kg) 225 lb 15.5 oz (102.5 kg)    Telemetry    Atrial flutter, but appears to be back in SR with LBBB- Personally Reviewed  ECG    SR with LBBB, wider QRS complex - Personally Reviewed  Physical Exam   General: Pale diaphoretic older W male dyspneic at rest Head: Normocephalic, atraumatic.  Neck: Supple without bruits, + JVD. Lungs:  Resp regular, but labored, Diminished with faint crackles throughout. Heart: RRR, S1, S2, no S3, S4, or murmur; no rub. Abdomen: Soft, non-tender, + distended with normoactive bowel sounds. Extremities: No clubbing, cyanosis, edema. Distal pedal pulses are 2+ bilaterally. Neuro: Alert and oriented X 3. Moves all extremities spontaneously.  Labs    Chemistry Recent Labs  Lab 11/03/2017 0223 November 12, 2017 0025 11/07/17 0846  NA 137 136 133*  K 3.8 3.4* 4.0  CL 105 101 94*  CO2 21* 24 24  GLUCOSE 291* 206* 271*  BUN 32* 32* 44*  CREATININE 1.83* 1.93* 2.83*  CALCIUM 7.9*  8.0* 8.1*  PROT  --   --  6.9  ALBUMIN  --   --  3.6  AST  --   --  28  ALT  --   --  20  ALKPHOS  --   --  83  BILITOT  --   --  1.1  GFRNONAA 33* 31* 20*  GFRAA 39* 36* 23*  ANIONGAP 11 11 15      Hematology Recent Labs  Lab 11/26/2017 0223 11/20/2017 0025 11/07/17 0846  WBC 9.4 9.5 9.1  RBC 4.00* 3.81* 3.46*  HGB 12.2* 11.5* 10.4*  HCT 36.4* 34.4* 31.8*  MCV 91.0 90.3 91.9  MCH 30.5 30.2 30.1  MCHC 33.5 33.4 32.7  RDW 14.8 15.1 15.3  PLT 144* 134* 127*    Cardiac Enzymes Recent Labs  Lab 11/05/2017 1014 11/12/2017 1557 11/16/2017 1956  TROPONINI 0.39* 0.69* 0.47*    Recent Labs  Lab 11/09/2017 0233 11/04/2017 0641  TROPIPOC 0.09* 0.17*     BNP Recent Labs  Lab 11/27/2017 0430  BNP 410.1*     DDimer No results for input(s): DDIMER in the last 168 hours.    Radiology    Dg Chest 2 View  Result Date:  11/25/2017 CLINICAL DATA:  Acute respiratory failure with hypoxia, shortness of breath. History of COPD, coronary artery disease and previous MI, former smoker. EXAM: CHEST  2 VIEW COMPARISON:  Chest x-ray and chest CT scan of November 05, 2016 FINDINGS: Today's study is obtained in a more lordotic projection than was the earlier study. The pulmonary vascularity is engorged. The interstitial markings are increased and areas of patchy airspace opacity have developed on the left. There is increased density at the right base likely reflecting subsegmental atelectasis. The cardiac silhouette remains enlarged. The bony structures exhibit no acute abnormalities. IMPRESSION: Worsening of CHF now with interstitial and alveolar edema. Probable right basilar subsegmental atelectasis. Electronically Signed   By: David  [place] M.D.   On: 11/29/2017 07:49   Dg Chest Port 1 View  Result Date: 11/23/2017 CLINICAL DATA:  Tachypnea and shortness of breath EXAM: PORTABLE CHEST 1 VIEW COMPARISON:  Earlier today FINDINGS: Cardiomegaly. Diffuse interstitial and airspace opacity that is fairly symmetric. No visible effusion or pneumothorax. Chronic cardiomegaly. IMPRESSION: CHF pattern that is stable to mildly increased from earlier today. Electronically Signed   By: Marnee Spring M.D.   On: 11/23/2017 23:13    Cardiac Studies   TTE: 11/12/2017  Study Conclusions  - Left ventricle: apex not well seen cannot r/o mural thrombus. The   cavity size was severely dilated. Wall thickness was increased in   a pattern of mild LVH. The estimated ejection fraction was 20%.   Diffuse hypokinesis. Doppler parameters are consistent with both   elevated ventricular end-diastolic filling pressure and elevated   left atrial filling pressure. - Mitral valve: There was moderate regurgitation. - Left atrium: The atrium was moderately dilated. - Atrial septum: No defect or patent foramen ovale was identified. - Pulmonary arteries: PA peak  pressure: 56 mm Hg (S).  Cath: 11/06/17  Conclusion     Dist RCA lesion is 100% stenosed.  Mid RCA lesion is 80% stenosed.  Mid Cx lesion is 90% stenosed.  Ost LAD to Prox LAD lesion is 50% stenosed.  Ost 1st Diag lesion is 50% stenosed.  Prox LAD lesion is 50% stenosed.  Prox LAD to Mid LAD lesion is 60% stenosed.   Significant multivessel CAD with coronary calcification.  The LAD proximally is  calcified with diffuse 50% narrowing before the first diagonal takeoff, 50% near ostial diagonal stenosis with 50% LAD stenosis immediately after the diagonal vessel followed by a longer 60 stenosis; 90% stenosis in the distal circumflex.  The circumflex immediately after its ostium gives rise to a branch to the right side of the heart which then supplies collaterals to the distal RCA.  There is also distal circumflex collateralization to the distal RCA.  The RCA has diffuse 80% stenosis, and after acute marginal branch is totally occluded.  This is a chronic occlusion.  There are very faint collaterals from the marginal branch to the distal RCA and more extensive left-to-right collaterals supplying the distal RCA.  LVEDP 23 mm.  Previous echo documentation of EF approximately 20%.  RECOMMENDATION: Will review angiograms with colleagues.  The patient's baseline creatinine today at 1.93, gentle hydration will be administered.  The patient has severe ischemic cardiomyopathy.  Renal function will be followed closely.  Options may include surgical consultation for CABG in this patient with significant comorbidities versus PCI to at least the circumflex.  Optimal medical therapy for CAD and acute on chronic CHF.   Patient Profile     82 y.o. male with PMH of CAD ( CTO of RCA with collaterals, diffuse disease), ICM, HTN, HL, and DM who presented with chest pain and shortness of breath.Found to have positive troponins, and EF of 20%. Underwent cardiac cath on 11/25/2017.  Assessment & Plan    1.  Chest pain:Presented with sudden onset of chest pain and shortness of breath around 430pm the day prior to admission. Trop peaked at 0.6. Was placed on IV nitro, and IV heparin. Underwent cardiac cath noted above with significant multivessel disease, with EF noted at 20%. Do not do well last night, and had recurrent shortness of breath and chest pain. Difficult situation as he is likely not a candidate for surgical intervention. Appears conversation was had with patient about Code Status, and now a DNR. May need to consider palliative care input. -- continue IV nitro, and heparin for now. MD to follow up.  2. ICM: Known EF of 30% with global hypokinesis back in 2016. BNP 400 and CXR with edema. Echo this admission shows EF of 20% with PA pressure of 56mmHg. He was given several doses of IV lasix yesterday as his respiratory status declined last evening. UOP 1.3L yesterday -- on BB, but no room for ARB/spironolactone as his Cr is worse today.  3. Atrial Flutter: developed last evening. Now on IV amiodarone and appears to be back in SR with rates in the 90s.  -- on IV heparin for now  4. HTN: Stable with current therapy  5. HL:was not on home statin. Trig 654, unable to calculate LDL. -- continue lipitor 40mg    6. IDDM: Hgb A1c 6.8.  -- SSI while inpatient  7. CKD vs AKI: Appears he likely has some degree of CKD though baseline is unclear. Cr 1.8>>1.9>>2.8. Again, complex situation as he remains volume overloaded, but Cr continuing to rise. Consider Nephrology consult.   Signed, Laverda PageLindsay Roberts, NP  11/07/2017, 10:35 AM  Pager # [phone]   For questions or updates, please contact [facility] Please consult [email or web address] for contact info under Cardiology/STEMI.   Patient seen, examined. Available data reviewed. Agree with findings, assessment, and plan as outlined by Laverda PageLindsay Roberts, NP-C.   On my exam the patient is awake and alert, answers questions appropriately but not really  oriented to time.  JVP is  elevated.  Lungs have diminished breath sounds throughout but no crackles.  Heart is regular rate and rhythm with a 3/6 holosystolic murmur at the apex with distant heart sounds.  The abdomen is soft nontender and distended.  Extremities have no significant edema.  I reviewed the patient's cath and echo results, clinical notes, and telemetry.  He has become increasingly ill overnight with progressive symptoms of heart failure.  All of the clinical notes are reviewed at length.  He refused BiPAP and is now DNR.  I reviewed the potential treatment options for cardiac disease.  He is clearly not a candidate for CABG now or in the future.  He has severe diffuse is most appropriate for medical therapy.  The patient appears to have advanced heart failure in this elderly gentleman with acute on chronic kidney injury, LVEF 20%, and worsening heart failure clearly has a poor prognosis.  I discussed all of this at length with the patient and his family members who are present today.  I think it is reasonable to continue IV nitroglycerin and IV diuresis with furosemide to try to improve his filling pressures and breathing.  Will also continue IV heparin and amiodarone and follow his condition closely. However, it seems appropriate to involve palliative care sooner rather than later.  The patient is in agreement with this plan. If his care moves in more of a palliative approach his IV medicines can be discontinued.   The patient is critically ill with multiple organ systems failure and requires high complexity decision making for assessment and support, frequent evaluation and titration of therapies, application of advanced monitoring technologies and extensive interpretation of multiple databases.   Critical Care Time devoted to patient care services described in this note is 40 minutes.   Tonny Bollman, M.D. 11/07/2017 11:12 AM

## 2017-11-07 NOTE — Progress Notes (Signed)
Per family member, pt unable to tolerate BIPAP. RT will continue to monitor

## 2017-11-07 NOTE — Progress Notes (Signed)
PROGRESS NOTE    Mark Flynn  RUE:[id number] DOB: 05/25/1936 DOA: 11/13/17 PCP: Anselmo Pickler, MD   Brief Narrative:   82 y.o. WM PMHx COPD, Diabetes Type II controlled with complication, HLD, HTN, CAD  S/P Cardiac Catheterization at [facility] about 3 years ago, which showed "some blockages, not stented, treated medically ",   Presented to the emergency department with acute onset of chest pain while at rest last night.  He took several sublingual nitroglycerin without relief, as well as aspirin 324 mg on transit, with an extra dose of nitroglycerin, with some better control of his symptoms.  He states that on exertion, on with movement, this chest pain is also present.  He denies any prior symptoms as described.  The patient also reports ongoing dyspnea for the last 3 months, worse on exertion.  He is not on oxygen at home.  He recently quit tobacco about 1 week ago.  He denies any trauma to the chest.  He denies any recent long distance trips. No history of PE or DVT . He denies fevers, chills, night sweats or sick contacts.  He reports Increasing abdominal girth despite taking more Lasix.  The patient denies any nausea or vomiting.  He denies any urine retention  He does report intermittent lower extremity swelling, improved with Lasix.  He denies any calf pain. Patient drinks approximately 3-4 shots liquor a night. Denies recreational drug use.    ED Course:  BP (!) 123/59 (BP Location: Right Arm)   Pulse (!) 103   Temp (!) 97.5 F (36.4 C) (Oral)   Resp (!) 23   Ht 6\' 1"  (1.854 m)   Wt 102.1 kg (225 lb)   SpO2 95%   BMI 29.69 kg/m    Sinus or ectopic atrial tachycardia Left bundle branch block No old tracing to compare Troponin was elevated in the setting of demand ischemia from CHF, and hypoxia CT angiogram of the chest was negative for PE, findings consistent with CHF, and pneumonia was suspected as well Heparin started per Pharmacy for possible NSTEMI Cardiology,Dr. Jacinto Halim,  to see the patient today  Chemistries remarkable for glucose 291, creatinine 1.83 White count 9.4, hemoglobin 12.2, platelets 144   Subjective: 1/9 A/O 4, positive SOB, negative CP negative abdominal pain negative N/V. Does not use O2 at home, does not use CPAP at home.    Assessment & Plan:   Active Problems:   Chronic kidney disease, stage II (mild)   Coronary artery disease involving native coronary artery   Dementia associated with other underlying disease without behavioral disturbance   Essential hypertension   Type 2 diabetes mellitus (HCC)   Chest pain   Hyperlipidemia   Acute respiratory failure with hypoxia (HCC)   Depression   BPH (benign prostatic hyperplasia)   NSTEMI (non-ST elevated myocardial infarction) (HCC)   Acute on chronic systolic CHF (congestive heart failure), NYHA class 2 (HCC)  Acute respiratory failure with hypoxia/COPD exacerbation -Acute respiratory failure most likely secondary to acute on chronic CHF. Questionable  true COPD exacerbation -CT angiogram chest negative PE see results below -Titrate O2 to maintain SPO2 89 -93% -DuoNeb TID -CPAP/BIPAP PRN/QHS: Patient agreed to use CPAP/BIPAP when sleeping -Morphine 1-2 mg PRN increase WOB  Acute on Chronic Systolic CHF/Ischemic dilated cardiomyopathy (unknown base weight) -Strict I&O -Daily weight -CABG? Per cardiac catheterization notes patient may be candidate for CABG (does not appear to be good candidate for CABG). Will await cardiology recommendations. Dr. Jacinto Halim -Amiodarone drip -Coreg  3.125 mg -Lasix 60 mg BID -Heparin drip -See goals of care --See CKD  Pulmonary HTN -See CHF    Chest pain syndrome/CAD -HEART score  =4 Recent Labs  Lab 10/31/2017 1014 11/09/2017 1557 11/02/2017 1956  TROPONINI 0.39* 0.69* 0.47*  -Demand ischemia vs NSTEMI . Patient with cardiac catheterization showing worsening EF see results below, therefore leading toward acute event. -See CHF    Essential  Hypertension    -See CHF     Hyperlipidemia -1/8 lipid panel not within ADA/AHA guidelines.  -1/9 Increase Lipitor 80 mg daily  GERD, -Continue Ranexa   CKD stage II (unknown baseline Cr)  Recent Labs  Lab 11/07/2017 0223 2018-01-06 0025  CREATININE 1.83* 1.93*  -Monitor closely -Hold ACEI/ARB, secondary to worsening renal status -Hold all nephrotoxic medication   Type II Diabetes  controlled with peripheral neuropathy  -1/7 Hgb A1C= 6.8 -Hold all PO diabetic medication -Levemir 12 units daily -Sensitive SSI  EtOH Abuse -Per EMR last drink 4 shots EtOH prior to admission -CIWA protocol   Depression -Wellbutrin 300 mg daily -Prozac 40 g daily  Depression -Continue home Prozac   BPH -Proscar and Hytrin  Goals of care -1/9 PALLIATIVE CARE consult: Overnight patient was made DO NOT RESUSCITATE has been informed that will most likely require CABG. Discussed short-term vs long-term goals care. Would patient even want CABG? If not transition to palliative care vs hospice    DVT prophylaxis: Heparin drip Code Status: DO NOT RESUSCITATE Family Communication: Family at bedside for discussion of plan of care Disposition Plan: TBD   Consultants:  Cardiology Cardiothoracic surgery pending    Procedures/Significant Events:  1/7 Echocardiogram:Left ventricle: apex not well seen cannot r/o mural thrombus. Cavity size was severely dilated. -LVEF= 20%. Diffuse hypokinesis.  - Mitral valve: moderate regurgitation.- Left atrium: moderately dilated. - Pulmonary arteries: PA peak pressure: 56 mm Hg (S). 1/8 Cardiac catheterization:  -Significant multivessel CAD with coronary calcification.  The LAD proximally is calcified with diffuse 50% narrowing before the first diagonal takeoff, 50% near ostial diagonal stenosis with 50% LAD stenosis immediately after the diagonal vessel followed by a longer 60 stenosis; 90% stenosis in the distal circumflex.  The circumflex immediately after  its ostium gives rise to a branch to the right side of the heart which then supplies collaterals to the distal RCA.  There is also distal circumflex collateralization to the distal RCA.  The RCA has diffuse 80% stenosis, and after acute marginal branch is totally occluded.  This is a chronic occlusion.  There are very faint collaterals from the marginal branch to the distal RCA and more extensive left-to-right collaterals supplying the distal RCA.   LVEDP 23 mm.  Previous echo documentation of EF approximately 20%.   RECOMMENDATION: Will review angiograms with colleagues.  The patient's baseline creatinine today at 1.93, gentle hydration will be administered.  The patient has severe ischemic cardiomyopathy.  Renal function will be followed closely.  Options may include surgical consultation for CABG in this patient with significant comorbidities versus PCI to at least the circumflex.  Optimal medical therapy for CAD and acute on chronic CHF.     I have personally reviewed and interpreted all radiology studies and my findings are as above.  VENTILATOR SETTINGS:    Cultures   Antimicrobials: Anti-infectives (From admission, onward)   None       Devices    LINES / TUBES:      Continuous Infusions: . sodium chloride 10 mL/hr at 2018-01-06 0943  .  sodium chloride    . amiodarone 30 mg/hr (11/07/17 0453)  . heparin 1,700 Units/hr (11/07/17 0002)  . nitroGLYCERIN 15 mcg/min (12/03/2017 1833)     Objective: Vitals:   11/07/17 0330 11/07/17 0400 11/07/17 0430 11/07/17 0500  BP: 111/77 131/68 134/78   Pulse: 100 94 94   Resp: 20 (!) 25 (!) 23   Temp:      TempSrc:      SpO2:  92% 92%   Weight:    225 lb 15.5 oz (102.5 kg)  Height:        Intake/Output Summary (Last 24 hours) at 11/07/2017 0730 Last data filed at 11/07/2017 0400 Gross per 24 hour  Intake 1308.46 ml  Output 1100 ml  Net 208.46 ml   Filed Weights   12-03-2017 0451 Dec 03, 2017 1008 11/07/17 0500  Weight: 225 lb  (102.1 kg) 224 lb 13.9 oz (102 kg) 225 lb 15.5 oz (102.5 kg)    Examination:  General: A/O 4, positive Acute respiratory distress Neck:  Negative scars, masses, torticollis, lymphadenopathy, JVD Lungs: Clear to auscultation bilaterally, mild LEFT lung expiratory wheeze, negative crackles  Cardiovascular: Regular rate and rhythm without murmur gallop or rub normal S1 and S2 Abdomen: Obese, negative abdominal pain, nondistended, positive soft, bowel sounds, no rebound, no ascites, no appreciable mass Extremities: No significant cyanosis, clubbing, or edema bilateral lower extremities Skin: Negative rashes, lesions, ulcers Psychiatric:  Negative depression, negative anxiety, negative fatigue, negative mania  Central nervous system:  Cranial nerves II through XII intact, tongue/uvula midline, all extremities muscle strength 5/5, sensation intact throughout, negative dysarthria, negative expressive aphasia, negative receptive aphasia.  .     Data Reviewed: Care during the described time interval was provided by me .  I have reviewed this patient's available data, including medical history, events of note, physical examination, and all test results as part of my evaluation.   CBC: Recent Labs  Lab 11/04/2017 0223 2017/12/03 0025  WBC 9.4 9.5  HGB 12.2* 11.5*  HCT 36.4* 34.4*  MCV 91.0 90.3  PLT 144* 134*   Basic Metabolic Panel: Recent Labs  Lab 11/06/2017 0223 December 03, 2017 0025  NA 137 136  K 3.8 3.4*  CL 105 101  CO2 21* 24  GLUCOSE 291* 206*  BUN 32* 32*  CREATININE 1.83* 1.93*  CALCIUM 7.9* 8.0*   GFR: Estimated Creatinine Clearance: 39 mL/min (A) (by C-G formula based on SCr of 1.93 mg/dL (H)). Liver Function Tests: No results for input(s): AST, ALT, ALKPHOS, BILITOT, PROT, ALBUMIN in the last 168 hours. No results for input(s): LIPASE, AMYLASE in the last 168 hours. No results for input(s): AMMONIA in the last 168 hours. Coagulation Profile: Recent Labs  Lab  10/30/2017 1014  INR 0.98   Cardiac Enzymes: Recent Labs  Lab 11/18/2017 1014 11/20/2017 1557 11/12/2017 1956  TROPONINI 0.39* 0.69* 0.47*   BNP (last 3 results) No results for input(s): PROBNP in the last 8760 hours. HbA1C: Recent Labs    11/22/2017 1013  HGBA1C 6.8*   CBG: Recent Labs  Lab 2017/12/03 0818 12/03/2017 1228 December 03, 2017 1733 2017/12/03 1937 12-03-2017 2113  GLUCAP 215* 215* 188* 176* 184*   Lipid Profile: Recent Labs    12/03/17 0025  CHOL 275*  HDL 38*  LDLCALC UNABLE TO CALCULATE IF TRIGLYCERIDE OVER 400 mg/dL  TRIG 409*  CHOLHDL 7.2   Thyroid Function Tests: Recent Labs    11/16/2017 1014  TSH 1.841   Anemia Panel: No results for input(s): VITAMINB12, FOLATE, FERRITIN, TIBC, IRON, RETICCTPCT  in the last 72 hours. Urine analysis: No results found for: COLORURINE, APPEARANCEUR, LABSPEC, PHURINE, GLUCOSEU, HGBUR, BILIRUBINUR, KETONESUR, PROTEINUR, UROBILINOGEN, NITRITE, LEUKOCYTESUR Sepsis Labs: @LABRCNTIP (procalcitonin:4,lacticidven:4)  ) Recent Results (from the past 240 hour(s))  MRSA PCR Screening     Status: None   Collection Time: Nov 24, 2017  3:20 PM  Result Value Ref Range Status   MRSA by PCR NEGATIVE NEGATIVE Final    Comment:        The GeneXpert MRSA Assay (FDA approved for NASAL specimens only), is one component of a comprehensive MRSA colonization surveillance program. It is not intended to diagnose MRSA infection nor to guide or monitor treatment for MRSA infections.          Radiology Studies: Dg Chest 2 View  Result Date: 11/04/2017 CLINICAL DATA:  Acute respiratory failure with hypoxia, shortness of breath. History of COPD, coronary artery disease and previous MI, former smoker. EXAM: CHEST  2 VIEW COMPARISON:  Chest x-ray and chest CT scan of November 05, 2016 FINDINGS: Today's study is obtained in a more lordotic projection than was the earlier study. The pulmonary vascularity is engorged. The interstitial markings are increased and  areas of patchy airspace opacity have developed on the left. There is increased density at the right base likely reflecting subsegmental atelectasis. The cardiac silhouette remains enlarged. The bony structures exhibit no acute abnormalities. IMPRESSION: Worsening of CHF now with interstitial and alveolar edema. Probable right basilar subsegmental atelectasis. Electronically Signed   By: David  [place] M.D.   On: 11/03/2017 07:49   Dg Chest Port 1 View  Result Date: 11/01/2017 CLINICAL DATA:  Tachypnea and shortness of breath EXAM: PORTABLE CHEST 1 VIEW COMPARISON:  Earlier today FINDINGS: Cardiomegaly. Diffuse interstitial and airspace opacity that is fairly symmetric. No visible effusion or pneumothorax. Chronic cardiomegaly. IMPRESSION: CHF pattern that is stable to mildly increased from earlier today. Electronically Signed   By: Marnee Spring M.D.   On: 11/24/2017 23:13        Scheduled Meds: . aspirin  81 mg Oral Daily  . atorvastatin  40 mg Oral q1800  . buPROPion  300 mg Oral Daily  . carvedilol  3.125 mg Oral BID WC  . finasteride  5 mg Oral Daily  . FLUoxetine  40 mg Oral Daily  . folic acid  1 mg Oral Daily  . furosemide  60 mg Intravenous BID  . gabapentin  300 mg Oral BID  . insulin aspart  0-9 Units Subcutaneous TID WC  . insulin detemir  12 Units Subcutaneous QHS  . mouth rinse  15 mL Mouth Rinse BID  . multivitamin with minerals  1 tablet Oral Daily  . pantoprazole  40 mg Oral Daily  . ranolazine  500 mg Oral QPM  . sodium chloride flush  3 mL Intravenous Q12H  . tamsulosin  0.4 mg Oral Daily  . terazosin  2 mg Oral QHS  . thiamine  100 mg Oral Daily  . [START ON 11/08/2017] Vitamin D (Ergocalciferol)  50,000 Units Oral Q7 days   Continuous Infusions: . sodium chloride 10 mL/hr at 11/26/2017 0943  . sodium chloride    . amiodarone 30 mg/hr (11/07/17 0453)  . heparin 1,700 Units/hr (11/07/17 0002)  . nitroGLYCERIN 15 mcg/min (10/30/2017 1833)     LOS: 2 days     Time spent: 40 minutes    Samreet Edenfield, Roselind Messier, MD [facility] Pager [phone]   If 7PM-7AM, please contact night-coverage [email or web address] Password [email or web address] 11/07/2017, 7:30 AM

## 2017-11-07 NOTE — Progress Notes (Signed)
Inpatient Diabetes Program Recommendations  AACE/ADA: New Consensus Statement on Inpatient Glycemic Control (2015)  Target Ranges:  Prepandial:   less than 140 mg/dL      Peak postprandial:   less than 180 mg/dL (1-2 hours)      Critically ill patients:  140 - 180 mg/dL   Lab Results  Component Value Date   GLUCAP 277 (H) 11/07/2017   HGBA1C 6.8 (H) 11/01/2017    Review of Glycemic ControlResults for [name], Mark (MRN [id number]) as of 11/07/2017 10:12  Ref. Range 10/30/2017 08:18 11/18/2017 12:28 11/09/2017 17:33 11/25/2017 19:37 11/14/2017 21:13 11/07/2017 08:50  Glucose-Capillary Latest Ref Range: 65 - 99 mg/dL 409215 (H) 811215 (H) 914188 (H) 176 (H) 184 (H) 277 (H)    Diabetes history: Type 2 DM Outpatient Diabetes medications: Amaryl 4 mg daily with breakfast, Levemir 12 units q AM, Januvia 100 mg daily Current orders for Inpatient glycemic control: Levemir 12 units q HS, Novolog sensitive tid with meals  Inpatient Diabetes Program Recommendations:    Note that patient received one time dose of Solumedrol overnight which likely has increased AM blood sugar.  May consider increasing Novolog correction to moderate and increase Levemir to 16 units q HS.   Thanks,  Beryl MeagerJenny Lalla Laham, RN, BC-ADM Inpatient Diabetes Coordinator Pager [phone] (8a-5p)

## 2017-11-08 ENCOUNTER — Inpatient Hospital Stay (HOSPITAL_COMMUNITY): Payer: Medicare Other

## 2017-11-08 DIAGNOSIS — E7849 Other hyperlipidemia: Secondary | ICD-10-CM

## 2017-11-08 DIAGNOSIS — Z515 Encounter for palliative care: Secondary | ICD-10-CM

## 2017-11-08 DIAGNOSIS — J441 Chronic obstructive pulmonary disease with (acute) exacerbation: Secondary | ICD-10-CM

## 2017-11-08 DIAGNOSIS — F1010 Alcohol abuse, uncomplicated: Secondary | ICD-10-CM

## 2017-11-08 DIAGNOSIS — R400 Somnolence: Secondary | ICD-10-CM

## 2017-11-08 DIAGNOSIS — N400 Enlarged prostate without lower urinary tract symptoms: Secondary | ICD-10-CM

## 2017-11-08 DIAGNOSIS — N189 Chronic kidney disease, unspecified: Secondary | ICD-10-CM

## 2017-11-08 DIAGNOSIS — R0682 Tachypnea, not elsewhere classified: Secondary | ICD-10-CM

## 2017-11-08 DIAGNOSIS — N179 Acute kidney failure, unspecified: Secondary | ICD-10-CM

## 2017-11-08 DIAGNOSIS — N182 Chronic kidney disease, stage 2 (mild): Secondary | ICD-10-CM

## 2017-11-08 DIAGNOSIS — E1142 Type 2 diabetes mellitus with diabetic polyneuropathy: Secondary | ICD-10-CM

## 2017-11-08 DIAGNOSIS — F320 Major depressive disorder, single episode, mild: Secondary | ICD-10-CM

## 2017-11-08 DIAGNOSIS — I255 Ischemic cardiomyopathy: Secondary | ICD-10-CM

## 2017-11-08 DIAGNOSIS — I10 Essential (primary) hypertension: Secondary | ICD-10-CM

## 2017-11-08 DIAGNOSIS — I420 Dilated cardiomyopathy: Secondary | ICD-10-CM

## 2017-11-08 DIAGNOSIS — Z66 Do not resuscitate: Secondary | ICD-10-CM

## 2017-11-08 DIAGNOSIS — R079 Chest pain, unspecified: Secondary | ICD-10-CM

## 2017-11-08 DIAGNOSIS — I272 Pulmonary hypertension, unspecified: Secondary | ICD-10-CM

## 2017-11-08 LAB — HEPARIN LEVEL (UNFRACTIONATED): Heparin Unfractionated: 0.58 IU/mL (ref 0.30–0.70)

## 2017-11-08 LAB — COMPREHENSIVE METABOLIC PANEL
ALK PHOS: 72 U/L (ref 38–126)
ALT: 35 U/L (ref 17–63)
AST: 39 U/L (ref 15–41)
Albumin: 3.4 g/dL — ABNORMAL LOW (ref 3.5–5.0)
Anion gap: 16 — ABNORMAL HIGH (ref 5–15)
BILIRUBIN TOTAL: 0.9 mg/dL (ref 0.3–1.2)
BUN: 70 mg/dL — ABNORMAL HIGH (ref 6–20)
CALCIUM: 7.9 mg/dL — AB (ref 8.9–10.3)
CO2: 23 mmol/L (ref 22–32)
CREATININE: 4.56 mg/dL — AB (ref 0.61–1.24)
Chloride: 93 mmol/L — ABNORMAL LOW (ref 101–111)
GFR, EST AFRICAN AMERICAN: 13 mL/min — AB (ref >60)
GFR, EST NON AFRICAN AMERICAN: 11 mL/min — AB (ref >60)
Glucose, Bld: 256 mg/dL — ABNORMAL HIGH (ref 65–99)
Potassium: 4.2 mmol/L (ref 3.5–5.1)
Sodium: 132 mmol/L — ABNORMAL LOW (ref 135–145)
TOTAL PROTEIN: 6.4 g/dL — AB (ref 6.5–8.1)

## 2017-11-08 LAB — BLOOD GAS, ARTERIAL
Acid-base deficit: 4.1 mmol/L — ABNORMAL HIGH (ref 0.0–2.0)
BICARBONATE: 21.3 mmol/L (ref 20.0–28.0)
Drawn by: 244851
O2 Content: 14 L/min
O2 Saturation: 95.7 %
PH ART: 7.308 — AB (ref 7.350–7.450)
Patient temperature: 98.1
pCO2 arterial: 43.6 mmHg (ref 32.0–48.0)
pO2, Arterial: 89.8 mmHg (ref 83.0–108.0)

## 2017-11-08 LAB — GLUCOSE, CAPILLARY
GLUCOSE-CAPILLARY: 262 mg/dL — AB (ref 65–99)
GLUCOSE-CAPILLARY: 183 mg/dL — AB (ref 65–99)
GLUCOSE-CAPILLARY: 189 mg/dL — AB (ref 65–99)
Glucose-Capillary: 201 mg/dL — ABNORMAL HIGH (ref 65–99)

## 2017-11-08 LAB — CBC
HCT: 30.2 % — ABNORMAL LOW (ref 39.0–52.0)
Hemoglobin: 9.7 g/dL — ABNORMAL LOW (ref 13.0–17.0)
MCH: 29.5 pg (ref 26.0–34.0)
MCHC: 32.1 g/dL (ref 30.0–36.0)
MCV: 91.8 fL (ref 78.0–100.0)
PLATELETS: 127 10*3/uL — AB (ref 150–400)
RBC: 3.29 MIL/uL — AB (ref 4.22–5.81)
RDW: 15.5 % (ref 11.5–15.5)
WBC: 9 10*3/uL (ref 4.0–10.5)

## 2017-11-08 LAB — IRON AND TIBC
IRON: 21 ug/dL — AB (ref 45–182)
SATURATION RATIOS: 7 % — AB (ref 17.9–39.5)
TIBC: 311 ug/dL (ref 250–450)
UIBC: 290 ug/dL

## 2017-11-08 LAB — MAGNESIUM: Magnesium: 2.1 mg/dL (ref 1.7–2.4)

## 2017-11-08 MED ORDER — INSULIN DETEMIR 100 UNIT/ML ~~LOC~~ SOLN
16.0000 [IU] | Freq: Every day | SUBCUTANEOUS | Status: DC
  Administered 2017-11-08: 16 [IU] via SUBCUTANEOUS
  Filled 2017-11-08 (×2): qty 0.16

## 2017-11-08 MED ORDER — DM-GUAIFENESIN ER 30-600 MG PO TB12
1.0000 | ORAL_TABLET | Freq: Two times a day (BID) | ORAL | Status: DC | PRN
  Administered 2017-11-08: 1 via ORAL
  Filled 2017-11-08: qty 1

## 2017-11-08 MED ORDER — MORPHINE SULFATE (PF) 4 MG/ML IV SOLN
3.0000 mg | Freq: Once | INTRAVENOUS | Status: AC
  Administered 2017-11-08: 3 mg via INTRAVENOUS
  Filled 2017-11-08: qty 1

## 2017-11-08 MED ORDER — MORPHINE SULFATE (PF) 2 MG/ML IV SOLN: 2.0000 mg | INTRAVENOUS | Status: DC | PRN

## 2017-11-08 MED ORDER — IPRATROPIUM-ALBUTEROL 0.5-2.5 (3) MG/3ML IN SOLN: 3.0000 mL | Freq: Four times a day (QID) | RESPIRATORY_TRACT | Status: DC

## 2017-11-08 MED ORDER — DIAZEPAM 2 MG PO TABS: 2.0000 mg | ORAL_TABLET | Freq: Four times a day (QID) | ORAL | Status: DC | PRN

## 2017-11-08 MED ORDER — INSULIN ASPART 100 UNIT/ML ~~LOC~~ SOLN
0.0000 [IU] | SUBCUTANEOUS | Status: DC
  Administered 2017-11-08 (×3): 3 [IU] via SUBCUTANEOUS
  Administered 2017-11-08: 8 [IU] via SUBCUTANEOUS

## 2017-11-08 MED ORDER — MORPHINE SULFATE (PF) 2 MG/ML IV SOLN
1.0000 mg | INTRAVENOUS | Status: DC | PRN
  Administered 2017-11-08: 1 mg via INTRAVENOUS
  Filled 2017-11-08: qty 1

## 2017-11-08 MED ORDER — BENZONATATE 100 MG PO CAPS
100.0000 mg | ORAL_CAPSULE | Freq: Three times a day (TID) | ORAL | Status: DC | PRN
  Administered 2017-11-08: 100 mg via ORAL
  Filled 2017-11-08: qty 1

## 2017-11-08 MED ORDER — MILRINONE LACTATE IN DEXTROSE 20-5 MG/100ML-% IV SOLN
0.2500 ug/kg/min | INTRAVENOUS | Status: DC
  Administered 2017-11-08: 0.125 ug/kg/min via INTRAVENOUS
  Filled 2017-11-08 (×2): qty 100

## 2017-11-08 NOTE — Consult Note (Signed)
Referring Provider: No ref. provider found Primary Care Physician:  Anselmo Pickler, MD Primary Nephrologist:      Reason for Consultation:  Acute renal failure  Congestive heart failure  IV contrast administration and hypotension  HPI:  82 year old with type 2 diabetes and s/p cardiac catheterization 1/8 after presenting to ER with NSTEMI . He has an EF 20 and was not thought to be a candidate for revascularization  He has been made a no code by the family   He is on low dose coreg although is not on ACE or ARB  Creatinine on admission was 1.8   This has increased to 4.56  Today  There are no peripheral stigmata of atheroemboli Urine output has not been good since catheterization  He also had a CT chest to rule out PE ( this was negative)  He is lethargic and dyspneic although did wake to answer questions The blood pressure has been very soft    Past Medical History:  Diagnosis Date  . COPD (chronic obstructive pulmonary disease) (HCC)   . Coronary artery disease   . Diabetes mellitus without complication (HCC)   . Hyperlipidemia   . Hypertension     Past Surgical History:  Procedure Laterality Date  . HERNIA REPAIR    . JOINT REPLACEMENT    . LEFT HEART CATH AND CORONARY ANGIOGRAPHY N/A November 15, 2017   Procedure: LEFT HEART CATH AND CORONARY ANGIOGRAPHY;  Surgeon: Lennette Bihari, MD;  Location: [facility];  Service: Cardiovascular;  Laterality: N/A;    Prior to Admission medications   Medication Sig Start Date End Date Taking? Authorizing Provider  buPROPion (WELLBUTRIN XL) 300 MG 24 hr tablet Take 300 mg by mouth daily.   Yes [provider]  carvedilol (COREG) 3.125 MG tablet Take 3.125 mg by mouth 2 (two) times daily with a meal.   Yes [provider]  finasteride (PROSCAR) 5 MG tablet Take 5 mg by mouth daily.   Yes [provider]  FLUoxetine (PROZAC) 20 MG tablet Take 40 mg by mouth daily.   Yes [provider]  furosemide  (LASIX) 20 MG tablet Take 40 mg by mouth daily.   Yes [provider]  gabapentin (NEURONTIN) 300 MG capsule Take 300 mg by mouth 2 (two) times daily.   Yes [provider]  glimepiride (AMARYL) 4 MG tablet Take 4 mg by mouth daily with breakfast.   Yes [provider]  insulin detemir (LEVEMIR) 100 UNIT/ML injection Inject 12 Units into the skin every morning.   Yes [provider]  isosorbide dinitrate (ISORDIL) 10 MG tablet Take 10 mg by mouth daily.   Yes [provider]  lisinopril (PRINIVIL,ZESTRIL) 5 MG tablet Take 2.5 mg by mouth daily.   Yes [provider]  meloxicam (MOBIC) 15 MG tablet Take 7.5 mg by mouth daily.   Yes [provider]  Multiple Vitamins-Minerals (HEALTHY EYES PO) Take 1 tablet by mouth daily.   Yes [provider]  nitroGLYCERIN (NITROSTAT) 0.4 MG SL tablet Place 0.4 mg under the tongue every 5 (five) minutes as needed for chest pain.   Yes [provider]  omeprazole (PRILOSEC) 20 MG capsule Take 20 mg by mouth daily.   Yes [provider]  ranolazine (RANEXA) 500 MG 12 hr tablet Take 500 mg by mouth every evening.   Yes [provider]  sitaGLIPtin (JANUVIA) 100 MG tablet Take 100 mg by mouth daily.   Yes  [provider]  tamsulosin (FLOMAX) 0.4 MG CAPS capsule Take 0.4 mg by mouth daily.   Yes [provider]  terazosin (HYTRIN) 2 MG capsule Take 2 mg by mouth at bedtime.   Yes [provider]  Vitamin D, Ergocalciferol, (DRISDOL) 50000 units CAPS capsule Take 50,000 Units by mouth every 7 (seven) days.   Yes [provider]    Current Facility-Administered Medications  Medication Dose Route Frequency Provider Last Rate Last Dose  . 0.9 %  sodium chloride infusion   Intravenous Continuous Lonia Blood, MD 10 mL/hr at 11/07/17 0800    . 0.9 %  sodium chloride infusion  250 mL Intravenous PRN Lennette Bihari, MD      .  acetaminophen (TYLENOL) tablet 650 mg  650 mg Oral Q4H PRN Lennette Bihari, MD      . albuterol (PROVENTIL) (2.5 MG/3ML) 0.083% nebulizer solution 2.5 mg  2.5 mg Nebulization Q2H PRN Marlowe Kays E, PA-C   2.5 mg at 11/08/17 0243  . amiodarone (NEXTERONE PREMIX) 360-4.14 MG/200ML-% (1.8 mg/mL) IV infusion  30 mg/hr Intravenous Continuous Leda Gauze, NP 16.7 mL/hr at 11/08/17 0910 30 mg/hr at 11/08/17 0910  . aspirin chewable tablet 81 mg  81 mg Oral Daily Lennette Bihari, MD   81 mg at 11/07/17 1000  . atorvastatin (LIPITOR) tablet 80 mg  80 mg Oral q1800 Drema Dallas, MD   80 mg at 11/07/17 1837  . buPROPion (WELLBUTRIN XL) 24 hr tablet 300 mg  300 mg Oral Daily Marcos Eke, PA-C   300 mg at 11/07/17 1610  . carvedilol (COREG) tablet 3.125 mg  3.125 mg Oral BID WC Marlowe Kays E, PA-C   3.125 mg at 11/08/17 0849  . diazepam (VALIUM) tablet 2 mg  2 mg Oral Q6H PRN Drema Dallas, MD      . finasteride (PROSCAR) tablet 5 mg  5 mg Oral Daily Marcos Eke, PA-C   5 mg at 11/07/17 0915  . FLUoxetine (PROZAC) capsule 40 mg  40 mg Oral Daily Marcos Eke, PA-C   40 mg at 11/07/17 9604  . folic acid (FOLVITE) tablet 1 mg  1 mg Oral Daily Marcos Eke, PA-C   1 mg at 11/07/17 5409  . gabapentin (NEURONTIN) capsule 300 mg  300 mg Oral BID Marcos Eke, PA-C   300 mg at 11/07/17 2125  . gi cocktail (Maalox,Lidocaine,Donnatal)  30 mL Oral QID PRN Marcos Eke, PA-C   30 mL at 11/07/17 2227  . heparin ADULT infusion 100 units/mL (25000 units/275mL sodium chloride 0.45%)  1,900 Units/hr Intravenous Continuous Scarlett Presto, RPH 19 mL/hr at 11/08/17 0326 1,900 Units/hr at 11/08/17 0326  . insulin aspart (novoLOG) injection 0-15 Units  0-15 Units Subcutaneous Q4H Drema Dallas, MD   8 Units at 11/08/17 0848  . insulin detemir (LEVEMIR) injection 16 Units  16 Units Subcutaneous QHS Drema Dallas, MD      . ipratropium-albuterol (DUONEB) 0.5-2.5 (3) MG/3ML nebulizer solution  3 mL  3 mL Nebulization TID Drema Dallas, MD   3 mL at 11/08/17 0946  . LORazepam (ATIVAN) injection 2-3 mg  2-3 mg Intravenous Q1H PRN Drema Dallas, MD   2 mg at 11/07/17 1508  . MEDLINE mouth rinse  15 mL Mouth Rinse BID Jonah Blue, MD   15 mL at 11/07/17 2126  . morphine 2 MG/ML injection 1 mg  1 mg Intravenous Q4H PRN  Drema Dallas, MD      . multivitamin with minerals tablet 1 tablet  1 tablet Oral Daily Marcos Eke, PA-C   1 tablet at 11/07/17 1610  . nitroGLYCERIN (NITROSTAT) SL tablet 0.4 mg  0.4 mg Sublingual Q5 min PRN Lonia Blood, MD   0.4 mg at November 12, 2017 0907  . nitroGLYCERIN 50 mg in dextrose 5 % 250 mL (0.2 mg/mL) infusion  0-200 mcg/min Intravenous Titrated Lennette Bihari, MD 4.5 mL/hr at 11/07/17 2000 15 mcg/min at 11/07/17 2000  . ondansetron (ZOFRAN) injection 4 mg  4 mg Intravenous Q6H PRN Lennette Bihari, MD   4 mg at 12-Nov-2017 1717  . pantoprazole (PROTONIX) EC tablet 40 mg  40 mg Oral Daily Marcos Eke, PA-C   40 mg at 11/07/17 9604  . promethazine (PHENERGAN) injection 12.5 mg  12.5 mg Intravenous Q6H PRN Lonia Blood, MD      . ranolazine (RANEXA) 12 hr tablet 500 mg  500 mg Oral QPM Marcos Eke, PA-C   500 mg at Nov 12, 2017 1713  . sodium chloride flush (NS) 0.9 % injection 3 mL  3 mL Intravenous Q12H Lennette Bihari, MD   3 mL at 11/07/17 2128  . sodium chloride flush (NS) 0.9 % injection 3 mL  3 mL Intravenous PRN Lennette Bihari, MD      . tamsulosin [facility]) capsule 0.4 mg  0.4 mg Oral Daily Marlowe Kays E, PA-C   0.4 mg at 11/07/17 1000  . terazosin (HYTRIN) capsule 2 mg  2 mg Oral QHS Marcos Eke, PA-C   2 mg at 11/07/17 2125  . thiamine (VITAMIN B-1) tablet 100 mg  100 mg Oral Daily Marlowe Kays E, PA-C   100 mg at 11/07/17 1000  . Vitamin D (Ergocalciferol) (DRISDOL) capsule 50,000 Units  50,000 Units Oral Q7 days Marcos Eke, PA-C        Allergies as of 11/09/2017 - Review Complete 11/13/2017  Allergen Reaction Noted   . Ambien [zolpidem tartrate] Other (See Comments) 11/28/2017  . Codeine Other (See Comments) 11/18/2017    Family History  Problem Relation Age of Onset  . Hypertension Father     Social History   Socioeconomic History  . Marital status: Widowed    Spouse name: Not on file  . Number of children: Not on file  . Years of education: Not on file  . Highest education level: Not on file  Social Needs  . Financial resource strain: Not on file  . Food insecurity - worry: Not on file  . Food insecurity - inability: Not on file  . Transportation needs - medical: Not on file  . Transportation needs - non-medical: Not on file  Occupational History  . Not on file  Tobacco Use  . Smoking status: Former Games developer  . Smokeless tobacco: Never Used  Substance and Sexual Activity  . Alcohol use: No    Frequency: Never  . Drug use: No  . Sexual activity: Not on file  Other Topics Concern  . Not on file  Social History Narrative  . Not on file    Review of Systems: Gen: Denies any fever, chills, sweats, anorexia, + fatigue,+ weakness, + malaise,   HEENT: No visual complaints, No history of Retinopathy. Normal external appearance No Epistaxis or Sore throat. No sinusitis.   CV: +chest pain,+ angina, no palpitations, no syncope, + orthopnea, + PND,  + peripheral edema, Resp: + dyspnea at rest, +  dyspnea with exercise,  GI: Denies vomiting blood, jaundice, and fecal incontinence.   Denies dysphagia or odynophagia. GU : Denies urinary burning, blood in urine, urinary frequency, urinary hesitancy, nocturnal urination, and urinary incontinence.  No renal calculi. MS: Denies joint pain, limitation of movement, and swelling, stiffness, low back pain, extremity pain. Denies muscle weakness, cramps, atrophy.  No use of non steroidal antiinflammatory drugs. Derm: Denies rash, itching, dry skin, hives, moles, warts, or unhealing ulcers.  Psych: Denies depression, anxiety, memory loss, suicidal  ideation, hallucinations, paranoia, and confusion. Heme: Denies bruising, bleeding, and enlarged lymph nodes. Neuro: No headache.  No diplopia. No dysarthria.  No dysphasia.  No history of CVA.  No Seizures. No paresthesias.  No weakness. Endocrine + DM.  No Thyroid disease.  No Adrenal disease.  Physical Exam: Vital signs in last 24 hours: Temp:  [98 F (36.7 C)-98.2 F (36.8 C)] 98.1 F (36.7 C) (01/10 0755) Pulse Rate:  [79-94] 79 (01/10 0800) Resp:  [18-26] 21 (01/10 0800) BP: (93-119)/(59-89) 104/81 (01/10 0800) SpO2:  [89 %-97 %] 97 % (01/10 0947) FiO2 (%):  [91 %] 91 % (01/09 1210) Weight:  [235 lb 0.2 oz (106.6 kg)] 235 lb 0.2 oz (106.6 kg) (01/10 0555) Last BM Date: 11/21/2017 General:   Ill appearing man in some respiratory discomfort Head:  Normocephalic and atraumatic. Eyes:  Sclera clear, no icterus.   Conjunctiva pink. Ears:  Normal auditory acuity. Nose:  No deformity, discharge,  or lesions. Mouth:  No deformity or lesions, dentition normal. Neck:  Supple; no masses or thyromegaly. JVP elevated Lungs:  Diminished with rales  Heart:  Regular rate and rhythm; no murmurs, clicks, rubs,  or gallops. Abdomen:  Distended  Msk:  Symmetrical without gross deformities. Normal posture. Pulses:  No carotid, renal, femoral bruits. DP and PT symmetrical and equal Extremities:  Edema 3 +  Neurologic:  Alert and  oriented x4;  grossly normal neurologically. Skin:  Intact without significant lesions or rashes. Cervical Nodes:  No significant cervical adenopathy.   Intake/Output from previous day: 01/09 0701 - 01/10 0700 In: 1635.4 [P.O.:960; I.V.:675.4] Out: 325 [Urine:325] Intake/Output this shift: No intake/output data recorded.  Lab Results: Recent Labs    11/14/2017 0025 11/07/17 0846 11/08/17 0219  WBC 9.5 9.1 9.0  HGB 11.5* 10.4* 9.7*  HCT 34.4* 31.8* 30.2*  PLT 134* 127* 127*   BMET Recent Labs    11/16/2017 0025 11/07/17 0846 11/08/17 0219  NA 136 133*  132*  K 3.4* 4.0 4.2  CL 101 94* 93*  CO2 24 24 23   GLUCOSE 206* 271* 256*  BUN 32* 44* 70*  CREATININE 1.93* 2.83* 4.56*  CALCIUM 8.0* 8.1* 7.9*   LFT Recent Labs    11/08/17 0219  PROT 6.4*  ALBUMIN 3.4*  AST 39  ALT 35  ALKPHOS 72  BILITOT 0.9   PT/INR No results for input(s): LABPROT, INR in the last 72 hours. Hepatitis Panel No results for input(s): HEPBSAG, HCVAB, HEPAIGM, HEPBIGM in the last 72 hours.  Studies/Results: Dg Chest Port 1 View  Result Date: 11/15/2017 CLINICAL DATA:  Tachypnea and shortness of breath EXAM: PORTABLE CHEST 1 VIEW COMPARISON:  Earlier today FINDINGS: Cardiomegaly. Diffuse interstitial and airspace opacity that is fairly symmetric. No visible effusion or pneumothorax. Chronic cardiomegaly. IMPRESSION: CHF pattern that is stable to mildly increased from earlier today. Electronically Signed   By: Marnee SpringJonathon  Watts M.D.   On: 11/22/2017 23:13    Assessment/Plan:  Acute on chronic renal failure  following cardiac cath and IV contrast injection for CT scan  We shall check a renal ultrasound and also a urinalysis  He has significant hypotension and doubt that he would tolerate dialysis  Unfortunately it may be challenging to diurese due to low blood pressure.. I do not think he would be a candidate for long term dialysis   Certainly consideration could be given to CRRT if the family would wish although the long term prognosis is not good  Thank you for the consult  We shall follow along with you for now   LOS: 3 Einar Nolasco W @TODAY @11 :34 AM

## 2017-11-08 NOTE — Progress Notes (Signed)
PROGRESS NOTE    Mark Flynn  ZOX:[id number] DOB: 27-Dec-1935 DOA: 11/28/2017 PCP: Anselmo Pickler, MD   Brief Narrative:   82 y.o. WM PMHx COPD, Diabetes Type II controlled with complication, HLD, HTN, CAD  S/P Cardiac Catheterization at [facility] about 3 years ago, which showed "some blockages, not stented, treated medically ",   Presented to the emergency department with acute onset of chest pain while at rest last night.  He took several sublingual nitroglycerin without relief, as well as aspirin 324 mg on transit, with an extra dose of nitroglycerin, with some better control of his symptoms.  He states that on exertion, on with movement, this chest pain is also present.  He denies any prior symptoms as described.  The patient also reports ongoing dyspnea for the last 3 months, worse on exertion.  He is not on oxygen at home.  He recently quit tobacco about 1 week ago.  He denies any trauma to the chest.  He denies any recent long distance trips. No history of PE or DVT . He denies fevers, chills, night sweats or sick contacts.  He reports Increasing abdominal girth despite taking more Lasix.  The patient denies any nausea or vomiting.  He denies any urine retention  He does report intermittent lower extremity swelling, improved with Lasix.  He denies any calf pain. Patient drinks approximately 3-4 shots liquor a night. Denies recreational drug use.    ED Course:  BP (!) 123/59 (BP Location: Right Arm)   Pulse (!) 103   Temp (!) 97.5 F (36.4 C) (Oral)   Resp (!) 23   Ht 6\' 1"  (1.854 m)   Wt 102.1 kg (225 lb)   SpO2 95%   BMI 29.69 kg/m    Sinus or ectopic atrial tachycardia Left bundle branch block No old tracing to compare Troponin was elevated in the setting of demand ischemia from CHF, and hypoxia CT angiogram of the chest was negative for PE, findings consistent with CHF, and pneumonia was suspected as well Heparin started per Pharmacy for possible NSTEMI Cardiology,Dr. Jacinto Halim,  to see the patient today  Chemistries remarkable for glucose 291, creatinine 1.83 White count 9.4, hemoglobin 12.2, platelets 144   Subjective: 1/10 somnolent, arousable but will fall immediately back to sleep if not engaged. Negative CP, negative abdominal pain, negative N/V, positive SOB. Overnight patient refuses to wear BiPAP.     Assessment & Plan:   Active Problems:   Chronic kidney disease, stage II (mild)   Coronary artery disease involving native coronary artery   Dementia associated with other underlying disease without behavioral disturbance   Essential hypertension   Type 2 diabetes mellitus (HCC)   Chest pain   Hyperlipidemia   Acute respiratory failure with hypoxia (HCC)   Depression   BPH (benign prostatic hyperplasia)   NSTEMI (non-ST elevated myocardial infarction) (HCC)   Acute on chronic systolic CHF (congestive heart failure), NYHA class 2 (HCC)  Altered mental status -Most likely multifactorial respiratory failure with patient refusing BiPAP (increase CO2?), Uremia, sedating medication. -Spoke with daughter requested that she encourage patient to accept BiPAP. -Decrease sedating medication -Consulted Dr. Hyman Hopes Nephrology increasing renal failure.  Acute respiratory failure with hypoxia/COPD exacerbation -Acute respiratory failure most likely secondary to acute on chronic CHF. Questionable  true COPD exacerbation -CT angiogram chest negative PE see results below -Titrate O2 to maintain SPO2 89-93% -DuoNeb TID -CPAP/BIPAP PRN/QHS: Counseled daughter on sequela of not using C Pap to include significant increased risk  of additional MI, stroke, DEATH. Requested she encourage her father to accept the use of BiPAP.  Acute on Chronic Systolic CHF/Ischemic dilated cardiomyopathy (unknown base weight) -Strict I&O since admission +1.3 L -Daily weight Filed Weights   11/14/2017 1008 11/07/17 0500 11/08/17 0555  Weight: 224 lb 13.9 oz (102 kg) 225 lb 15.5 oz (102.5  kg) 235 lb 0.2 oz (106.6 kg)  -although multivessel disease Per Dr. Calton Dach cardiology patient not good candidate for CABG -Amiodarone drip per cardiology -Coreg 3.25 mg BID -DC Lasix secondary to significant increase renal failure -Heparin drip -See goals of care --See CKD  Pulmonary HTN -See CHF    Chest pain syndrome/CAD -HEART score  =4 Recent Labs  Lab 11/23/2017 1014 11/25/2017 1557 11/18/2017 1956  TROPONINI 0.39* 0.69* 0.47*  -Demand ischemia vs NSTEMI . Patient with cardiac catheterization showing worsening EF see results below, therefore leading toward acute event. -See CHF    Essential Hypertension    -See CHF     Hyperlipidemia -1/8 lipid panel not within ADA/AHA guidelines.  - Lipitor 80 mg daily   GERD, -Continue Ranexa   Acute on CKD stage II (unknown baseline Cr)   Recent Labs  Lab 11/29/2017 0223 11/11/2017 0025 11/07/17 0846 11/08/17 0219  CREATININE 1.83* 1.93* 2.83* 4.56*  -Increasing renal failure most likely multifactorial hypoperfusion secondary to patient's NSTEMI, and cardiac catheterization (dye load) -Monitor closely -Hold ACEI/ARB, secondary to worsening renal status -Hold all nephrotoxic medication -1/10 significantly worsened creatinine discontinue Lasix -1/10 Dr. Hyman Hopes Nephrology to see patient   Type II Diabetes  controlled with peripheral neuropathy  -1/7 Hgb A1C= 6.8 -Hold all PO diabetic medication -1/10 increase Levemir 16 units daily -1/10 increase moderate SSI   EtOH Abuse -Per EMR last drink 4 shots EtOH prior to admission -CIWA protocol   Depression -Wellbutrin 300 mg daily -Prozac 40 g daily  BPH -Proscar 5 mg daily  -Hytrin 2 mg daily  Goals of care -1/9 PALLIATIVE CARE consult: Overnight patient was made DO NOT RESUSCITATE has been informed that will most likely require CABG. Discussed short-term vs long-term goals care. Would patient even want CABG? If not transition to palliative care vs hospice    DVT  prophylaxis: Heparin drip Code Status: DO NOT RESUSCITATE Family Communication: Family at bedside for discussion of plan of care Disposition Plan: TBD   Consultants:  Cardiology Cardiothoracic surgery pending    Procedures/Significant Events:  1/7 Echocardiogram:Left ventricle: apex not well seen cannot r/o mural thrombus. Cavity size was severely dilated. -LVEF= 20%. Diffuse hypokinesis.  - Mitral valve: moderate regurgitation.- Left atrium: moderately dilated. - Pulmonary arteries: PA peak pressure: 56 mm Hg (S). 1/8 Cardiac catheterization:  -Significant multivessel CAD with coronary calcification.  The LAD proximally is calcified with diffuse 50% narrowing before the first diagonal takeoff, 50% near ostial diagonal stenosis with 50% LAD stenosis immediately after the diagonal vessel followed by a longer 60 stenosis; 90% stenosis in the distal circumflex.  The circumflex immediately after its ostium gives rise to a branch to the right side of the heart which then supplies collaterals to the distal RCA.  There is also distal circumflex collateralization to the distal RCA.  The RCA has diffuse 80% stenosis, and after acute marginal branch is totally occluded.  This is a chronic occlusion.  There are very faint collaterals from the marginal branch to the distal RCA and more extensive left-to-right collaterals supplying the distal RCA.   LVEDP 23 mm.  Previous echo documentation of EF  approximately 20%.   RECOMMENDATION: Will review angiograms with colleagues.  The patient's baseline creatinine today at 1.93, gentle hydration will be administered.  The patient has severe ischemic cardiomyopathy.  Renal function will be followed closely.  Options may include surgical consultation for CABG in this patient with significant comorbidities versus PCI to at least the circumflex.  Optimal medical therapy for CAD and acute on chronic CHF.     I have personally reviewed and interpreted all radiology  studies and my findings are as above.  VENTILATOR SETTINGS:    Cultures   Antimicrobials: Anti-infectives (From admission, onward)   None       Devices    LINES / TUBES:      Continuous Infusions: . sodium chloride 10 mL/hr at 11/07/17 0800  . sodium chloride    . amiodarone 30 mg/hr (11/07/17 2124)  . heparin 1,900 Units/hr (11/08/17 0326)  . nitroGLYCERIN 15 mcg/min (11/07/17 2000)     Objective: Vitals:   11/08/17 0530 11/08/17 0555 11/08/17 0600 11/08/17 0601  BP: (!) 99/59  (!) 97/59 (!) 102/59  Pulse: 80  80 83  Resp: 20  20 20   Temp:      TempSrc:      SpO2: 94%  94% 93%  Weight:  235 lb 0.2 oz (106.6 kg)    Height:        Intake/Output Summary (Last 24 hours) at 11/08/2017 0659 Last data filed at 11/08/2017 0400 Gross per 24 hour  Intake 1635.4 ml  Output 325 ml  Net 1310.4 ml   Filed Weights   11/18/2017 1008 11/07/17 0500 11/08/17 0555  Weight: 224 lb 13.9 oz (102 kg) 225 lb 15.5 oz (102.5 kg) 235 lb 0.2 oz (106.6 kg)    Physical Exam:  General: Somnolent but arousable, will immediately fall back to sleep if not engaged positive acute respiratory distress Neck:  Negative scars, masses, torticollis, lymphadenopathy, JVD Lungs: Clear to auscultation bilaterally without wheezes or crackles Cardiovascular: Regular rate and rhythm without murmur gallop or rub normal S1 and S2 Abdomen: Morbidly obese, negative abdominal pain, nondistended, positive soft, bowel sounds, no rebound, no ascites, no appreciable mass Extremities: No significant cyanosis, clubbing, or edema bilateral lower extremities Skin: Negative rashes, lesions, ulcers Psychiatric:  Unable to fully evaluate secondary to altered mental status  Central nervous system:  Cranial nerves II through XII intact, tongue/uvula midline, follows commands. Unable to evaluate secondary to altered mental status. Positive dysarthria, positive aphasia, negative receptive aphasia.      Data  Reviewed: Care during the described time interval was provided by me .  I have reviewed this patient's available data, including medical history, events of note, physical examination, and all test results as part of my evaluation.   CBC: Recent Labs  Lab 11/12/2017 0223 11/01/2017 0025 11/07/17 0846 11/08/17 0219  WBC 9.4 9.5 9.1 9.0  HGB 12.2* 11.5* 10.4* 9.7*  HCT 36.4* 34.4* 31.8* 30.2*  MCV 91.0 90.3 91.9 91.8  PLT 144* 134* 127* 127*   Basic Metabolic Panel: Recent Labs  Lab 11/23/2017 0223 11/26/2017 0025 11/07/17 0846 11/08/17 0219  NA 137 136 133* 132*  K 3.8 3.4* 4.0 4.2  CL 105 101 94* 93*  CO2 21* 24 24 23   GLUCOSE 291* 206* 271* 256*  BUN 32* 32* 44* 70*  CREATININE 1.83* 1.93* 2.83* 4.56*  CALCIUM 7.9* 8.0* 8.1* 7.9*  MG  --   --  2.0 2.1   GFR: Estimated Creatinine Clearance: 16.8 mL/min (A) (by  C-G formula based on SCr of 4.56 mg/dL (H)). Liver Function Tests: Recent Labs  Lab 11/07/17 0846 11/08/17 0219  AST 28 39  ALT 20 35  ALKPHOS 83 72  BILITOT 1.1 0.9  PROT 6.9 6.4*  ALBUMIN 3.6 3.4*   No results for input(s): LIPASE, AMYLASE in the last 168 hours. No results for input(s): AMMONIA in the last 168 hours. Coagulation Profile: Recent Labs  Lab 11/14/2017 1014  INR 0.98   Cardiac Enzymes: Recent Labs  Lab 11/01/2017 1014 11/13/2017 1557 11/02/2017 1956  TROPONINI 0.39* 0.69* 0.47*   BNP (last 3 results) No results for input(s): PROBNP in the last 8760 hours. HbA1C: Recent Labs    11/21/2017 1013  HGBA1C 6.8*   CBG: Recent Labs  Lab 11/23/2017 2113 11/07/17 0850 11/07/17 1207 11/07/17 1655 11/07/17 2134  GLUCAP 184* 277* 218* 190* 214*   Lipid Profile: Recent Labs    11/01/2017 0025  CHOL 275*  HDL 38*  LDLCALC UNABLE TO CALCULATE IF TRIGLYCERIDE OVER 400 mg/dL  TRIG 161*  CHOLHDL 7.2   Thyroid Function Tests: Recent Labs    11/04/2017 1014  TSH 1.841   Anemia Panel: No results for input(s): VITAMINB12, FOLATE, FERRITIN, TIBC,  IRON, RETICCTPCT in the last 72 hours. Urine analysis: No results found for: COLORURINE, APPEARANCEUR, LABSPEC, PHURINE, GLUCOSEU, HGBUR, BILIRUBINUR, KETONESUR, PROTEINUR, UROBILINOGEN, NITRITE, LEUKOCYTESUR Sepsis Labs: @LABRCNTIP (procalcitonin:4,lacticidven:4)  ) Recent Results (from the past 240 hour(s))  MRSA PCR Screening     Status: None   Collection Time: 11/23/2017  3:20 PM  Result Value Ref Range Status   MRSA by PCR NEGATIVE NEGATIVE Final    Comment:        The GeneXpert MRSA Assay (FDA approved for NASAL specimens only), is one component of a comprehensive MRSA colonization surveillance program. It is not intended to diagnose MRSA infection nor to guide or monitor treatment for MRSA infections.          Radiology Studies: Dg Chest 2 View  Result Date: 11/08/2017 CLINICAL DATA:  Acute respiratory failure with hypoxia, shortness of breath. History of COPD, coronary artery disease and previous MI, former smoker. EXAM: CHEST  2 VIEW COMPARISON:  Chest x-ray and chest CT scan of November 05, 2016 FINDINGS: Today's study is obtained in a more lordotic projection than was the earlier study. The pulmonary vascularity is engorged. The interstitial markings are increased and areas of patchy airspace opacity have developed on the left. There is increased density at the right base likely reflecting subsegmental atelectasis. The cardiac silhouette remains enlarged. The bony structures exhibit no acute abnormalities. IMPRESSION: Worsening of CHF now with interstitial and alveolar edema. Probable right basilar subsegmental atelectasis. Electronically Signed   By: David  [place] M.D.   On: 11/01/2017 07:49   Dg Chest Port 1 View  Result Date: 11/25/2017 CLINICAL DATA:  Tachypnea and shortness of breath EXAM: PORTABLE CHEST 1 VIEW COMPARISON:  Earlier today FINDINGS: Cardiomegaly. Diffuse interstitial and airspace opacity that is fairly symmetric. No visible effusion or pneumothorax. Chronic  cardiomegaly. IMPRESSION: CHF pattern that is stable to mildly increased from earlier today. Electronically Signed   By: Marnee Spring M.D.   On: 11/21/2017 23:13        Scheduled Meds: . aspirin  81 mg Oral Daily  . atorvastatin  80 mg Oral q1800  . buPROPion  300 mg Oral Daily  . carvedilol  3.125 mg Oral BID WC  . finasteride  5 mg Oral Daily  . FLUoxetine  40  mg Oral Daily  . folic acid  1 mg Oral Daily  . furosemide  80 mg Intravenous Q8H  . gabapentin  300 mg Oral BID  . insulin aspart  0-9 Units Subcutaneous TID WC  . insulin detemir  12 Units Subcutaneous QHS  . ipratropium-albuterol  3 mL Nebulization TID  . mouth rinse  15 mL Mouth Rinse BID  . multivitamin with minerals  1 tablet Oral Daily  . pantoprazole  40 mg Oral Daily  . ranolazine  500 mg Oral QPM  . sodium chloride flush  3 mL Intravenous Q12H  . tamsulosin  0.4 mg Oral Daily  . terazosin  2 mg Oral QHS  . thiamine  100 mg Oral Daily  . Vitamin D (Ergocalciferol)  50,000 Units Oral Q7 days   Continuous Infusions: . sodium chloride 10 mL/hr at 11/07/17 0800  . sodium chloride    . amiodarone 30 mg/hr (11/07/17 2124)  . heparin 1,900 Units/hr (11/08/17 0326)  . nitroGLYCERIN 15 mcg/min (11/07/17 2000)     LOS: 3 days    Time spent: 40 minutes    WOODS, Roselind MessierURTIS J, MD [facility] Pager [phone]   If 7PM-7AM, please contact night-coverage [email or web address] Password [email or web address] 11/08/2017, 6:59 AM

## 2017-11-08 NOTE — Consult Note (Signed)
Consultation Note Date: 11/08/2017   Patient Name: Mark Flynn  DOB: December 27, 1935  MRN: [id number]  Age / Sex: 82 y.o., male  PCP: Anselmo Pickler, MD Referring Physician: Drema Dallas, MD  Reason for Consultation: Establishing goals of care and Psychosocial/spiritual support  HPI/Patient Profile: 82 y.o. male  admitted on 11/14/2017 with past medical history of  COPD, Diabetes Type II controlled with complication, HLD, HTN, CAD S/P Cardiac Catheterization at [facility] about 3 years ago, which showed "some blockages, not stented, treated medically "   Presented to the emergency department with acute onset of chest pain  He took several sublingual nitroglycerin without relief, as well as aspirin 324 mg on transit, with an extra dose of nitroglycerin, with some better control of his symptoms.   Per patient and family patient had ongoing ongoing dyspnea for the last 3 months, which got  worse on exertion.No home oxygen only unable/unwilling to tolerate BiPAP  To have positive troponins, an EF of 20%.  Underwent cardiac cath on 1/ /05/2018  Located medical situation with acute on chronic systolic heart failure, demand ischemia of the myocardium with multivessel coronary artery disease, a flutter with RVR, and now acute on chronic kidney  injury with creatinine at 4.56 today.  Patient and family face treatment options decisions, advanced directive decisions, goals of care and anticipatory care needs.   Clinical Assessment and Goals of Care:  This NP Lorinda Creed reviewed medical records, received report from team, assessed the patient and then meet at the patient's bedside along with his son and daughter to discuss diagnosis, prognosis, GOC, EOL wishes disposition and options.  Concept of Hospice and Palliative Care were discussed  A  discussion was had today regarding advanced directives.   The  difference between a aggressive medical intervention path  and a palliative comfort care path for this patient at this time was had.  Values and goals of care important to patient and family were attempted to be elicited.  Patient and family understand the seriousness of the situation.    Patient was able to verbalize that he remains hopeful for improvement over the next 24-48 hours,  however if he continues to decline and there are no viable treatment options to offer improvement and return to his baseline he wishes to shift to a comfort approach and allow a natural death.  Natural trajectory and expectations at EOL were discussed.  Questions and concerns addressed.   Family encouraged to call with questions or concerns.    We discussed the concept of hospice, patient and his family have had a bad experience in the past and would prefer not to involve hospice in his care.  I believe that if the patient shifts to a comfort approach prognosis would be hours to days and we would expect a hospital death.  This is a scenario that is acceptable to patient and family.  PMT will continue to support holistically.    NEXT OF KIN-documented healthcare power of attorney   in the event  that he could not speak for himself he would like his son Mark Fredericksonimmy Flynn and daughter Mark Flynn to advocate for him.  SUMMARY OF RECOMMENDATIONS    Code Status/Advance Care Planning:  DNR   Palliative Prophylaxis:   Aspiration, Bowel Regimen, Delirium Protocol, Frequent Pain Assessment and Oral Care  Additional Recommendations (Limitations, Scope, Preferences):  As discussed with Dr. Excell Seltzerooper, patient and family are open to medication adjustments,  and a watchful waiting approach over the next 24 hours as they remain hopeful for improvement  Decisions will be based on outcomes over the next 24 hours  Psycho-social/Spiritual:   Desire for further Chaplaincy support:yes  Additional Recommendations: Education on  Hospice  Prognosis:   Unable to determine  Discharge Planning: To Be Determined      Primary Diagnoses: Present on Admission: . Chronic kidney disease, stage II (mild) . Coronary artery disease involving native coronary artery . Dementia associated with other underlying disease without behavioral disturbance . Essential hypertension . Acute respiratory failure with hypoxia (HCC) . NSTEMI (non-ST elevated myocardial infarction) (HCC)   I have reviewed the medical record, interviewed the patient and family, and examined the patient. The following aspects are pertinent.  Past Medical History:  Diagnosis Date  . COPD (chronic obstructive pulmonary disease) (HCC)   . Coronary artery disease   . Diabetes mellitus without complication (HCC)   . Hyperlipidemia   . Hypertension    Social History   Socioeconomic History  . Marital status: Widowed    Spouse name: None  . Number of children: None  . Years of education: None  . Highest education level: None  Social Needs  . Financial resource strain: None  . Food insecurity - worry: None  . Food insecurity - inability: None  . Transportation needs - medical: None  . Transportation needs - non-medical: None  Occupational History  . None  Tobacco Use  . Smoking status: Former Games developermoker  . Smokeless tobacco: Never Used  Substance and Sexual Activity  . Alcohol use: No    Frequency: Never  . Drug use: No  . Sexual activity: None  Other Topics Concern  . None  Social History Narrative  . None   Family History  Problem Relation Age of Onset  . Hypertension Father    Scheduled Meds: . aspirin  81 mg Oral Daily  . atorvastatin  80 mg Oral q1800  . buPROPion  300 mg Oral Daily  . carvedilol  3.125 mg Oral BID WC  . finasteride  5 mg Oral Daily  . FLUoxetine  40 mg Oral Daily  . folic acid  1 mg Oral Daily  . gabapentin  300 mg Oral BID  . insulin aspart  0-15 Units Subcutaneous Q4H  . insulin detemir  16 Units  Subcutaneous QHS  . ipratropium-albuterol  3 mL Nebulization TID  . mouth rinse  15 mL Mouth Rinse BID  . multivitamin with minerals  1 tablet Oral Daily  . pantoprazole  40 mg Oral Daily  . ranolazine  500 mg Oral QPM  . sodium chloride flush  3 mL Intravenous Q12H  . tamsulosin  0.4 mg Oral Daily  . terazosin  2 mg Oral QHS  . thiamine  100 mg Oral Daily  . Vitamin D (Ergocalciferol)  50,000 Units Oral Q7 days   Continuous Infusions: . sodium chloride 10 mL/hr at 11/07/17 0800  . sodium chloride    . amiodarone 30 mg/hr (11/08/17 0910)  . heparin 1,900 Units/hr (11/08/17 0326)  .  nitroGLYCERIN 15 mcg/min (11/07/17 2000)   PRN Meds:.sodium chloride, acetaminophen, albuterol, diazepam, gi cocktail, LORazepam, morphine injection, nitroGLYCERIN, ondansetron (ZOFRAN) IV, promethazine, sodium chloride flush Medications Prior to Admission:  Prior to Admission medications   Medication Sig Start Date End Date Taking? Authorizing Provider  buPROPion (WELLBUTRIN XL) 300 MG 24 hr tablet Take 300 mg by mouth daily.   Yes [provider]  carvedilol (COREG) 3.125 MG tablet Take 3.125 mg by mouth 2 (two) times daily with a meal.   Yes [provider]  finasteride (PROSCAR) 5 MG tablet Take 5 mg by mouth daily.   Yes [provider]  FLUoxetine (PROZAC) 20 MG tablet Take 40 mg by mouth daily.   Yes [provider]  furosemide (LASIX) 20 MG tablet Take 40 mg by mouth daily.   Yes [provider]  gabapentin (NEURONTIN) 300 MG capsule Take 300 mg by mouth 2 (two) times daily.   Yes [provider]  glimepiride (AMARYL) 4 MG tablet Take 4 mg by mouth daily with breakfast.   Yes [provider]  insulin detemir (LEVEMIR) 100 UNIT/ML injection Inject 12 Units into the skin every morning.   Yes [provider]  isosorbide dinitrate (ISORDIL) 10 MG tablet Take 10 mg by mouth daily.   Yes [provider]  lisinopril  (PRINIVIL,ZESTRIL) 5 MG tablet Take 2.5 mg by mouth daily.   Yes [provider]  meloxicam (MOBIC) 15 MG tablet Take 7.5 mg by mouth daily.   Yes [provider]  Multiple Vitamins-Minerals (HEALTHY EYES PO) Take 1 tablet by mouth daily.   Yes [provider]  nitroGLYCERIN (NITROSTAT) 0.4 MG SL tablet Place 0.4 mg under the tongue every 5 (five) minutes as needed for chest pain.   Yes [provider]  omeprazole (PRILOSEC) 20 MG capsule Take 20 mg by mouth daily.   Yes [provider]  ranolazine (RANEXA) 500 MG 12 hr tablet Take 500 mg by mouth every evening.   Yes [provider]  sitaGLIPtin (JANUVIA) 100 MG tablet Take 100 mg by mouth daily.   Yes [provider]  tamsulosin (FLOMAX) 0.4 MG CAPS capsule Take 0.4 mg by mouth daily.   Yes [provider]  terazosin (HYTRIN) 2 MG capsule Take 2 mg by mouth at bedtime.   Yes [provider]  Vitamin D, Ergocalciferol, (DRISDOL) 50000 units CAPS capsule Take 50,000 Units by mouth every 7 (seven) days.   Yes [provider]   Allergies  Allergen Reactions  . Ambien [Zolpidem Tartrate] Other (See Comments)    Drives me crazy  . Codeine Other (See Comments)    unknown   Review of Systems  Respiratory: Positive for shortness of breath.   Neurological: Positive for weakness.    Physical Exam  Constitutional: He is oriented to person, place, and time. He appears well-developed.  Cardiovascular: Normal rate.  Pulmonary/Chest: Accessory muscle usage present. Tachypnea noted.  Abdominal: There is tenderness.  Neurological: He is alert and oriented to person, place, and time.  Skin: Skin is warm and dry.    Vital Signs: BP (!) 95/59   Pulse 84   Temp 98.6 F (37 C) (Oral)   Resp (!) 21   Ht 6\' 1"  (1.854 m)   Wt 106.6 kg (235 lb 0.2 oz)   SpO2 90%   BMI 31.01 kg/m  Pain Assessment: No/denies pain POSS *See Group Information*: 1-Acceptable,Awake  and alert Pain Score: Asleep   SpO2: SpO2:  90 % O2 Device:SpO2: 90 % O2 Flow Rate: .O2 Flow Rate (L/min): 14 L/min  IO: Intake/output summary:   Intake/Output Summary (Last 24 hours) at 11/08/2017 1417 Last data filed at 11/08/2017 0400 Gross per 24 hour  Intake 977.4 ml  Output 100 ml  Net 877.4 ml    LBM: Last BM Date: 11/14/2017 Baseline Weight: Weight: 102.1 kg (225 lb) Most recent weight: Weight: 106.6 kg (235 lb 0.2 oz)     Palliative Assessment/Data: 30 % at best   Discussed with Dr. Excell Seltzer and Dr Joseph Art  Time In: 1300 Time Out: 1415 Time Total: 75 min Greater than 50%  of this time was spent counseling and coordinating care related to the above assessment and plan.  Signed by: Lorinda Creed, NP   Please contact Palliative Medicine Team phone at [phone] for questions and concerns.  For individual provider: See Loretha Stapler

## 2017-11-08 NOTE — Progress Notes (Signed)
ANTICOAGULATION CONSULT NOTE - Follow Up Consult  Pharmacy Consult for Heparin Indication: chest pain/ACS  Patient Measurements: Height: 6\' 1"  (185.4 cm) Weight: 235 lb 0.2 oz (106.6 kg) IBW/kg (Calculated) : 79.9 Heparin Dosing Weight: 100 kg  Vital Signs: Temp: 98.1 F (36.7 C) (01/10 0755) Temp Source: Oral (01/10 0755) BP: 104/81 (01/10 0800) Pulse Rate: 79 (01/10 0800)  Labs: Recent Labs    11/28/2017 1557 10/31/2017 1956  10/30/2017 0025 11/07/17 0846 11/07/17 1959 11/08/17 0219  HGB  --   --    < > 11.5* 10.4*  --  9.7*  HCT  --   --   --  34.4* 31.8*  --  30.2*  PLT  --   --   --  134* 127*  --  127*  HEPARINUNFRC  --   --   --  0.24* 0.21* 0.53 0.58  CREATININE  --   --   --  1.93* 2.83*  --  4.56*  TROPONINI 0.69* 0.47*  --   --   --   --   --    < > = values in this interval not displayed.    Estimated Creatinine Clearance: 16.8 mL/min (A) (by C-G formula based on SCr of 4.56 mg/dL (H)).  Assessment:   Heparin resumed about 10 hrs after cath on 1/8.   Heparin level remains therapeutic (0.58) on 1900 units/hr   CBC trending down some, platelet count 144>>127>127.  No bleeding reported.          Creatinine rising.  IV Lasix stopped.  Goal of Therapy:  Heparin level 0.3-0.7 units/ml Monitor platelets by anticoagulation protocol: Yes   Plan:   Continue heparin drip at 1900 units/hr  Daily heparin level and CBC while on heparin.  Follow up plans.  Dennie FettersEgan, Sabeen Piechocki Donovan, RPh Pager: [phone] 11/08/2017,10:59 AM

## 2017-11-08 NOTE — Progress Notes (Signed)
Pt unable to tolerate BIPAP. RT  To cont to monitor.

## 2017-11-08 NOTE — Progress Notes (Signed)
Progress Note  Patient Name: Mark Flynn Date of Encounter: 11/08/2017  Primary Cardiologist: No primary care provider on file.   Subjective   Breathing better.  No chest pain.  Still feels tight in his abdomen.  Inpatient Medications    Scheduled Meds: . aspirin  81 mg Oral Daily  . atorvastatin  80 mg Oral q1800  . buPROPion  300 mg Oral Daily  . carvedilol  3.125 mg Oral BID WC  . finasteride  5 mg Oral Daily  . FLUoxetine  40 mg Oral Daily  . folic acid  1 mg Oral Daily  . gabapentin  300 mg Oral BID  . insulin aspart  0-15 Units Subcutaneous Q4H  . insulin detemir  16 Units Subcutaneous QHS  . ipratropium-albuterol  3 mL Nebulization TID  . mouth rinse  15 mL Mouth Rinse BID  . multivitamin with minerals  1 tablet Oral Daily  . pantoprazole  40 mg Oral Daily  . ranolazine  500 mg Oral QPM  . sodium chloride flush  3 mL Intravenous Q12H  . tamsulosin  0.4 mg Oral Daily  . terazosin  2 mg Oral QHS  . thiamine  100 mg Oral Daily  . Vitamin D (Ergocalciferol)  50,000 Units Oral Q7 days   Continuous Infusions: . sodium chloride 10 mL/hr at 11/07/17 0800  . sodium chloride    . amiodarone 30 mg/hr (11/08/17 0910)  . heparin 1,900 Units/hr (11/08/17 0326)  . nitroGLYCERIN 15 mcg/min (11/07/17 2000)   PRN Meds: sodium chloride, acetaminophen, albuterol, diazepam, gi cocktail, LORazepam, morphine injection, nitroGLYCERIN, ondansetron (ZOFRAN) IV, promethazine, sodium chloride flush   Vital Signs    Vitals:   11/08/17 0800 11/08/17 0947 11/08/17 1200 11/08/17 1206  BP: 104/81  (!) 95/59   Pulse: 79  84   Resp: (!) 21  (!) 21   Temp:    98.6 F (37 C)  TempSrc:    Oral  SpO2: 97% 97% 90%   Weight:      Height:        Intake/Output Summary (Last 24 hours) at 11/08/2017 1414 Last data filed at 11/08/2017 0400 Gross per 24 hour  Intake 977.4 ml  Output 100 ml  Net 877.4 ml   Filed Weights   11/26/2017 1008 11/07/17 0500 11/08/17 0555  Weight: 224 lb 13.9 oz  (102 kg) 225 lb 15.5 oz (102.5 kg) 235 lb 0.2 oz (106.6 kg)    Telemetry    Sinus rhythm - Personally Reviewed  Physical Exam  Elderly male, comfortable GEN: No acute distress.   Neck:  Moderate JVD Cardiac: RRR, no murmurs, rubs, or gallops.  Respiratory: Diminished bilaterally in the bases GI:  Distended, firm, nontender MS: No edema; No deformity. Neuro:  Nonfocal  Psych: Normal affect   Labs    Chemistry Recent Labs  Lab 11/18/2017 0025 11/07/17 0846 11/08/17 0219  NA 136 133* 132*  K 3.4* 4.0 4.2  CL 101 94* 93*  CO2 24 24 23   GLUCOSE 206* 271* 256*  BUN 32* 44* 70*  CREATININE 1.93* 2.83* 4.56*  CALCIUM 8.0* 8.1* 7.9*  PROT  --  6.9 6.4*  ALBUMIN  --  3.6 3.4*  AST  --  28 39  ALT  --  20 35  ALKPHOS  --  83 72  BILITOT  --  1.1 0.9  GFRNONAA 31* 20* 11*  GFRAA 36* 23* 13*  ANIONGAP 11 15 16*     Hematology Recent Labs  Lab 11/23/2017 0025 11/07/17 0846 11/08/17 0219  WBC 9.5 9.1 9.0  RBC 3.81* 3.46* 3.29*  HGB 11.5* 10.4* 9.7*  HCT 34.4* 31.8* 30.2*  MCV 90.3 91.9 91.8  MCH 30.2 30.1 29.5  MCHC 33.4 32.7 32.1  RDW 15.1 15.3 15.5  PLT 134* 127* 127*    Cardiac Enzymes Recent Labs  Lab 09/12/2018 1014 09/12/2018 1557 09/12/2018 1956  TROPONINI 0.39* 0.69* 0.47*    Recent Labs  Lab 09/12/2018 0233 09/12/2018 0641  TROPIPOC 0.09* 0.17*     BNP Recent Labs  Lab 09/12/2018 0430  BNP 410.1*     DDimer No results for input(s): DDIMER in the last 168 hours.   Radiology    Dg Chest Port 1 View  Result Date: 11/20/2017 CLINICAL DATA:  Tachypnea and shortness of breath EXAM: PORTABLE CHEST 1 VIEW COMPARISON:  Earlier today FINDINGS: Cardiomegaly. Diffuse interstitial and airspace opacity that is fairly symmetric. No visible effusion or pneumothorax. Chronic cardiomegaly. IMPRESSION: CHF pattern that is stable to mildly increased from earlier today. Electronically Signed   By: Marnee SpringJonathon  Watts M.D.   On: 11/23/2017 23:13     Patient Profile       82 y.o. male with PMH of CAD ( CTO of RCA with collaterals, diffuse disease), ICM, HTN, HL, and DM who presented with chest pain and shortness of breath.Found to have positive troponins, and EF of 20%.Underwent cardiac cath on 11/22/2017.  Assessment & Plan    1.  Acute on chronic systolic heart failure 2.  Demand ischemia of the myocardium with multivessel coronary artery disease 3.  Atrial flutter with RVR, stable in sinus rhythm on IV 4.  Acute on chronic kidney injury with delta creatinine 1.93 to 2.83-4.56 mg/dL.  The patient's family is at the bedside.  I have discussed the fact that he now has multiorgan dysfunction with severe cardiomyopathy, decompensated heart failure, and now acute renal failure probably related to cardiorenal syndrome.  I have spoken with the patient and multiple family members over the past several days and he would want renal replacement therapy considering his advanced age and comorbid medical conditions.  We discussed this at length today.  He is also seeing the palliative medicine team.  I am going to give him a trial of IV milrinone to see if we can augment his cardiac output, but I am not optimistic that this will impact his overall situation.  We will continue to follow him closely.  We will discontinue IV nitroglycerin and give him a trial of IV milrinone.  For questions or updates, please contact [facility] Please consult [email or web address] for contact info under Cardiology/STEMI.      Signed, Tonny BollmanMichael Markeem Noreen, MD  11/08/2017, 2:14 PM

## 2017-11-08 NOTE — Progress Notes (Signed)
Palliative Medicine consult noted. Due to high referral volume, there may be a delay seeing this patient. Please call the Palliative Medicine Team office at [phone] if recommendations are needed in the interim.  Thank you for inviting us to see this patient.  Margret ChanceMelanie G. Wenceslao Loper, RN, BSN, [facility] Palliative Medicine Team 11/08/2017 8:23 AM Office [phone]

## 2017-11-09 ENCOUNTER — Inpatient Hospital Stay (HOSPITAL_COMMUNITY): Payer: Medicare Other

## 2017-11-09 DIAGNOSIS — K567 Ileus, unspecified: Secondary | ICD-10-CM

## 2017-11-09 DIAGNOSIS — N183 Chronic kidney disease, stage 3 (moderate): Secondary | ICD-10-CM

## 2017-11-09 LAB — BASIC METABOLIC PANEL
Anion gap: 20 — ABNORMAL HIGH (ref 5–15)
BUN: 100 mg/dL — AB (ref 6–20)
CO2: 20 mmol/L — AB (ref 22–32)
CREATININE: 6.64 mg/dL — AB (ref 0.61–1.24)
Calcium: 7.5 mg/dL — ABNORMAL LOW (ref 8.9–10.3)
Chloride: 88 mmol/L — ABNORMAL LOW (ref 101–111)
GFR calc non Af Amer: 7 mL/min — ABNORMAL LOW (ref >60)
GFR, EST AFRICAN AMERICAN: 8 mL/min — AB (ref >60)
GLUCOSE: 196 mg/dL — AB (ref 65–99)
Potassium: 3.8 mmol/L (ref 3.5–5.1)
Sodium: 128 mmol/L — ABNORMAL LOW (ref 135–145)

## 2017-11-09 LAB — CBC
HCT: 27.8 % — ABNORMAL LOW (ref 39.0–52.0)
Hemoglobin: 9.2 g/dL — ABNORMAL LOW (ref 13.0–17.0)
MCH: 30 pg (ref 26.0–34.0)
MCHC: 33.1 g/dL (ref 30.0–36.0)
MCV: 90.6 fL (ref 78.0–100.0)
PLATELETS: 153 10*3/uL (ref 150–400)
RBC: 3.07 MIL/uL — AB (ref 4.22–5.81)
RDW: 15.5 % (ref 11.5–15.5)
WBC: 9.3 10*3/uL (ref 4.0–10.5)

## 2017-11-09 LAB — GLUCOSE, CAPILLARY: Glucose-Capillary: 232 mg/dL — ABNORMAL HIGH (ref 65–99)

## 2017-11-09 LAB — MAGNESIUM: Magnesium: 2.1 mg/dL (ref 1.7–2.4)

## 2017-11-09 LAB — HEPARIN LEVEL (UNFRACTIONATED): HEPARIN UNFRACTIONATED: 0.37 [IU]/mL (ref 0.30–0.70)

## 2017-11-09 MED ORDER — SODIUM CHLORIDE 0.9 % IV SOLN
0.5000 mg/h | INTRAVENOUS | Status: DC
Start: 2017-11-09 — End: 2017-11-09
  Filled 2017-11-09: qty 10

## 2017-11-09 MED ORDER — ONDANSETRON 4 MG PO TBDP: 4.0000 mg | ORAL_TABLET | Freq: Four times a day (QID) | ORAL | Status: DC | PRN

## 2017-11-09 MED ORDER — POLYVINYL ALCOHOL 1.4 % OP SOLN
1.0000 [drp] | Freq: Four times a day (QID) | OPHTHALMIC | Status: DC | PRN
  Filled 2017-11-09: qty 15

## 2017-11-09 MED ORDER — IPRATROPIUM-ALBUTEROL 0.5-2.5 (3) MG/3ML IN SOLN
3.0000 mL | RESPIRATORY_TRACT | Status: DC
  Administered 2017-11-09 (×2): 3 mL via RESPIRATORY_TRACT
  Filled 2017-11-09 (×3): qty 3

## 2017-11-09 MED ORDER — ACETAMINOPHEN 650 MG RE SUPP: 650.0000 mg | Freq: Four times a day (QID) | RECTAL | Status: DC | PRN

## 2017-11-09 MED ORDER — BIOTENE DRY MOUTH MT LIQD: 15.0000 mL | OROMUCOSAL | Status: DC | PRN

## 2017-11-09 MED ORDER — GLYCOPYRROLATE 0.2 MG/ML IJ SOLN
0.2000 mg | Freq: Three times a day (TID) | INTRAMUSCULAR | Status: DC | PRN
  Administered 2017-11-09 (×4): 0.2 mg via INTRAVENOUS
  Filled 2017-11-09 (×4): qty 1

## 2017-11-09 MED ORDER — ACETAMINOPHEN 325 MG PO TABS: 650.0000 mg | ORAL_TABLET | Freq: Four times a day (QID) | ORAL | Status: DC | PRN

## 2017-11-09 MED ORDER — ONDANSETRON HCL 4 MG/2ML IJ SOLN: 4.0000 mg | Freq: Four times a day (QID) | INTRAMUSCULAR | Status: DC | PRN

## 2017-11-09 MED ORDER — MORPHINE SULFATE (PF) 2 MG/ML IV SOLN
2.0000 mg | INTRAVENOUS | Status: DC | PRN
  Administered 2017-11-09 (×3): 2 mg via INTRAVENOUS
  Filled 2017-11-09 (×4): qty 1

## 2017-11-09 MED ORDER — LORAZEPAM 2 MG/ML IJ SOLN: 1.0000 mg | INTRAMUSCULAR | Status: DC | PRN

## 2017-11-09 MED ORDER — SODIUM CHLORIDE 0.9 % IV SOLN
0.5000 mg/h | INTRAVENOUS | Status: DC
  Administered 2017-11-09: 0.5 mg/h via INTRAVENOUS
  Filled 2017-11-09: qty 5

## 2017-11-09 MED ORDER — HYDROMORPHONE HCL 1 MG/ML IJ SOLN: 0.5000 mg | INTRAMUSCULAR | Status: DC | PRN

## 2017-11-09 MED ORDER — HALOPERIDOL LACTATE 5 MG/ML IJ SOLN: 0.5000 mg | Freq: Four times a day (QID) | INTRAMUSCULAR | Status: DC | PRN

## 2017-11-09 MED ORDER — DIAZEPAM 5 MG/ML IJ SOLN
2.5000 mg | Freq: Four times a day (QID) | INTRAMUSCULAR | Status: DC | PRN
  Administered 2017-11-09: 2.5 mg via INTRAVENOUS
  Filled 2017-11-09 (×2): qty 2

## 2017-11-09 NOTE — Progress Notes (Signed)
Valium 2.5 mg given to pt per daughter request.  Wasted 2.5 mg with Kirstie MirzaKendell Hill RN.

## 2017-11-09 NOTE — Progress Notes (Signed)
New NG placed at bedside, pt tolerated well. KUB ordered.

## 2017-11-09 NOTE — Progress Notes (Signed)
Pt resting much more comfortably.  RR teens, sats 98%, wheezing continues but less audible.  Family at bedside.

## 2017-11-09 NOTE — Progress Notes (Addendum)
ANTICOAGULATION CONSULT NOTE - Follow Up Consult  Pharmacy Consult for Heparin Indication: chest pain/ACS  Patient Measurements: Height: 6\' 1"  (185.4 cm) Weight: 237 lb 7 oz (107.7 kg) IBW/kg (Calculated) : 79.9 Heparin Dosing Weight: 100 kg  Vital Signs: Temp: 98.1 F (36.7 C) (01/11 0400) Temp Source: Axillary (01/11 0400) BP: 108/64 (01/11 0600) Pulse Rate: 104 (01/11 0600)  Labs: Recent Labs    11/07/17 0846 11/07/17 1959 11/08/17 0219 11/09/17 0242  HGB 10.4*  --  9.7* 9.2*  HCT 31.8*  --  30.2* 27.8*  PLT 127*  --  127* 153  HEPARINUNFRC 0.21* 0.53 0.58 0.37  CREATININE 2.83*  --  4.56* 6.64*    Estimated Creatinine Clearance: 11.6 mL/min (A) (by C-G formula based on SCr of 6.64 mg/dL (H)).  Assessment: [age] yom found with significant multivessel CAD, on heparin.   Heparin level this morning is therapeutic at 0.37, on 1900 units/hr. Hgb is trending down slightly to 9.2. Platelets trending up to 153. Patient had a few episodes of dark, brown emesis overnight. No issues with infusion. No signs/symptoms of bleeding.  Goal of Therapy:  Heparin level 0.3-0.7 units/ml Monitor platelets by anticoagulation protocol: Yes   Plan:  Continue heparin drip at 1900 units/hr Daily heparin level and CBC while on heparin. Follow up plans for heparin infusion  Girard CooterKimberly Perkins, PharmD Clinical Pharmacist  Pager: [phone] Clinical Phone for 11/09/2017 until 3:30pm: [phone] If after 3:30pm, please call main pharmacy at [phone] 11/09/2017,7:42 AM  ADDENDUM: Plan to transition to full comfort measures. Heparin infusion has been discontinued.

## 2017-11-09 NOTE — Progress Notes (Signed)
[facility] ROUNDING NOTE   Subjective:   Interval History:  Rough night has had some increased dyspnea - discussed with family  They are expecting him to pass . Appreciate palliative care help   Objective:  Vital signs in last 24 hours:  Temp:  [98.1 F (36.7 C)-100.2 F (37.9 C)] 100.2 F (37.9 C) (01/11 0806) Pulse Rate:  [61-107] 107 (01/11 0800) Resp:  [16-22] 20 (01/11 0800) BP: (93-135)/(55-87) 93/55 (01/11 0800) SpO2:  [90 %-99 %] 98 % (01/11 0800) Weight:  [237 lb 7 oz (107.7 kg)] 237 lb 7 oz (107.7 kg) (01/11 0500)  Weight change: 2 lb 6.8 oz (1.1 kg) Filed Weights   11/07/17 0500 11/08/17 0555 11/09/17 0500  Weight: 225 lb 15.5 oz (102.5 kg) 235 lb 0.2 oz (106.6 kg) 237 lb 7 oz (107.7 kg)    Intake/Output: I/O last 3 completed shifts: In: 2258.7 [P.O.:240; I.V.:2018.7] Out: 750 [Urine:250; Emesis/NG output:500]   Intake/Output this shift:  Total I/O In: 107.4 [I.V.:107.4] Out: -   CVS- RRR tachycardia  RS-  Poor air entry  Non re breather  Supplemental oxygen  ABD-  Distended  EXT- 2 + edema   Basic Metabolic Panel: Recent Labs  Lab Nov 30, 2017 0223 11/14/2017 0025 11/07/17 0846 11/08/17 0219 11/09/17 0242  NA 137 136 133* 132* 128*  K 3.8 3.4* 4.0 4.2 3.8  CL 105 101 94* 93* 88*  CO2 21* 24 24 23  20*  GLUCOSE 291* 206* 271* 256* 196*  BUN 32* 32* 44* 70* 100*  CREATININE 1.83* 1.93* 2.83* 4.56* 6.64*  CALCIUM 7.9* 8.0* 8.1* 7.9* 7.5*  MG  --   --  2.0 2.1 2.1    Liver Function Tests: Recent Labs  Lab 11/07/17 0846 11/08/17 0219  AST 28 39  ALT 20 35  ALKPHOS 83 72  BILITOT 1.1 0.9  PROT 6.9 6.4*  ALBUMIN 3.6 3.4*   No results for input(s): LIPASE, AMYLASE in the last 168 hours. No results for input(s): AMMONIA in the last 168 hours.  CBC: Recent Labs  Lab 30-Nov-2017 0223 11/04/2017 0025 11/07/17 0846 11/08/17 0219 11/09/17 0242  WBC 9.4 9.5 9.1 9.0 9.3  HGB 12.2* 11.5* 10.4* 9.7* 9.2*  HCT 36.4* 34.4* 31.8* 30.2*  27.8*  MCV 91.0 90.3 91.9 91.8 90.6  PLT 144* 134* 127* 127* 153    Cardiac Enzymes: Recent Labs  Lab 2017/11/30 1014 11/30/2017 1557 Nov 30, 2017 1956  TROPONINI 0.39* 0.69* 0.47*    BNP: Invalid input(s): POCBNP  CBG: Recent Labs  Lab 11/08/17 0758 11/08/17 1225 11/08/17 1604 11/08/17 2110 11/09/17 0808  GLUCAP 262* 201* 183* 189* 232*    Microbiology: Results for orders placed or performed during the hospital encounter of 30-Nov-2017  MRSA PCR Screening     Status: None   Collection Time: 2017-11-30  3:20 PM  Result Value Ref Range Status   MRSA by PCR NEGATIVE NEGATIVE Final    Comment:        The GeneXpert MRSA Assay (FDA approved for NASAL specimens only), is one component of a comprehensive MRSA colonization surveillance program. It is not intended to diagnose MRSA infection nor to guide or monitor treatment for MRSA infections.     Coagulation Studies: No results for input(s): LABPROT, INR in the last 72 hours.  Urinalysis: No results for input(s): COLORURINE, LABSPEC, PHURINE, GLUCOSEU, HGBUR, BILIRUBINUR, KETONESUR, PROTEINUR, UROBILINOGEN, NITRITE, LEUKOCYTESUR in the last 72 hours.  Invalid input(s): APPERANCEUR    Imaging: US Renal  Result Date: 11/08/2017  CLINICAL DATA:  Acute on chronic renal failure, history diabetes mellitus, hypertension, COPD, coronary artery disease EXAM: RENAL / URINARY TRACT ULTRASOUND COMPLETE COMPARISON:  CT abdomen pelvis 09/28/2014 FINDINGS: Right Kidney: Length: 10.0 cm. Cortical thinning. Normal cortical echogenicity. No mass, hydronephrosis or shadowing calcification Left Kidney: Length: 10.8 cm. Cortical thinning. Increased cortical echogenicity. No mass, hydronephrosis or shadowing calcification Bladder: Decompressed, unable to evaluate IMPRESSION: Cortical thinning of both kidneys without evidence of mass or hydronephrosis. Electronically Signed   By: Ulyses SouthwardMark  Boles M.D.   On: 11/08/2017 15:08   Dg Abd Portable 1v  Result  Date: 11/09/2017 CLINICAL DATA:  Nasogastric tube placement EXAM: PORTABLE ABDOMEN - 1 VIEW COMPARISON:  Portable exam 0822 hours compared to 11/09/2017 FINDINGS: Nasogastric tube coiled in distal esophagus with tip directed cranially. Mild gaseous distention of stomach. Gas and stool in transverse colon. Mild atelectasis at LEFT base. IMPRESSION: Nasogastric tube coiled in esophagus; recommend withdrawal and replacement. Findings called to Robin RN on [facility] Progressive [facility] on 11/20/2017 at 0936 hrs. Electronically Signed   By: Ulyses SouthwardMark  Boles M.D.   On: 11/09/2017 09:41   Dg Abd Portable 1v  Result Date: 11/09/2017 CLINICAL DATA:  82 year old male with NG tube placement. EXAM: PORTABLE ABDOMEN - 1 VIEW COMPARISON:  Radiograph dated 09/30/2014 FINDINGS: An enteric tube is partially visualized with tip over the upper lumbar spine likely in the distal stomach. Nondilated air-filled loops of small and large bowel in the visualized upper abdomen IMPRESSION: Enteric tube with tip likely in the distal stomach. Electronically Signed   By: Elgie CollardArash  Radparvar M.D.   On: 11/09/2017 01:50     Medications:   . sodium chloride 10 mL/hr at 11/09/17 0800  . sodium chloride    . amiodarone 30 mg/hr (11/09/17 0800)  . heparin 1,900 Units/hr (11/09/17 0800)  . HYDROmorphone    . milrinone 0.25 mcg/kg/min (11/08/17 1900)   . aspirin  81 mg Oral Daily  . atorvastatin  80 mg Oral q1800  . buPROPion  300 mg Oral Daily  . carvedilol  3.125 mg Oral BID WC  . finasteride  5 mg Oral Daily  . FLUoxetine  40 mg Oral Daily  . folic acid  1 mg Oral Daily  . gabapentin  300 mg Oral BID  . insulin detemir  16 Units Subcutaneous QHS  . ipratropium-albuterol  3 mL Nebulization Q4H  . mouth rinse  15 mL Mouth Rinse BID  . multivitamin with minerals  1 tablet Oral Daily  . pantoprazole  40 mg Oral Daily  . ranolazine  500 mg Oral QPM  . sodium chloride flush  3 mL Intravenous Q12H  . tamsulosin  0.4 mg Oral Daily  . terazosin   2 mg Oral QHS  . thiamine  100 mg Oral Daily  . Vitamin D (Ergocalciferol)  50,000 Units Oral Q7 days   sodium chloride, acetaminophen **OR** acetaminophen, albuterol, antiseptic oral rinse, benzonatate, dextromethorphan-guaiFENesin, diazepam, gi cocktail, glycopyrrolate, haloperidol lactate, HYDROmorphone **AND** HYDROmorphone (DILAUDID) injection, LORazepam, morphine injection, nitroGLYCERIN, ondansetron **OR** ondansetron (ZOFRAN) IV, polyvinyl alcohol, promethazine, sodium chloride flush  Assessment/ Plan:   Acute on chronic renal failure in setting of worsening congestive heart failure and NSTEMI  S/p cardiac catheterization and IV contrast administration  It appears that we are moving towards comfort care  This is very appropriate and in keeping with the family and patient wishes  We shall sign off    LOS: 4 Mayuri Staples W @TODAY @10 :21 AM

## 2017-11-09 NOTE — Progress Notes (Signed)
Unable to draw co ox lab, no central line in place.

## 2017-11-09 NOTE — Progress Notes (Signed)
PROGRESS NOTE    Mark Flynn  ZOX:[id number] DOB: 06/09/1936 DOA: 11/28/2017 PCP: Mark Pickler, MD   Brief Narrative:   82 y.o. WM PMHx COPD, Diabetes Type II controlled with complication, HLD, HTN, CAD  S/P Cardiac Catheterization at [facility] about 3 years ago, which showed "some blockages, not stented, treated medically ",   Presented to the emergency department with acute onset of chest pain while at rest last night.  He took several sublingual nitroglycerin without relief, as well as aspirin 324 mg on transit, with an extra dose of nitroglycerin, with some better control of his symptoms.  He states that on exertion, on with movement, this chest pain is also present.  He denies any prior symptoms as described.  The patient also reports ongoing dyspnea for the last 3 months, worse on exertion.  He is not on oxygen at home.  He recently quit tobacco about 1 week ago.  He denies any trauma to the chest.  He denies any recent long distance trips. No history of PE or DVT . He denies fevers, chills, night sweats or sick contacts.  He reports Increasing abdominal girth despite taking more Lasix.  The patient denies any nausea or vomiting.  He denies any urine retention  He does report intermittent lower extremity swelling, improved with Lasix.  He denies any calf pain. Patient drinks approximately 3-4 shots liquor a night. Denies recreational drug use.    ED Course:  BP (!) 123/59 (BP Location: Right Arm)   Pulse (!) 103   Temp (!) 97.5 F (36.4 C) (Oral)   Resp (!) 23   Ht 6\' 1"  (1.854 m)   Wt 102.1 kg (225 lb)   SpO2 95%   BMI 29.69 kg/m    Sinus or ectopic atrial tachycardia Left bundle branch block No old tracing to compare Troponin was elevated in the setting of demand ischemia from CHF, and hypoxia CT angiogram of the chest was negative for PE, findings consistent with CHF, and pneumonia was suspected as well Heparin started per Pharmacy for possible NSTEMI Cardiology,Dr. Jacinto Flynn,  to see the patient today  Chemistries remarkable for glucose 291, creatinine 1.83 White count 9.4, hemoglobin 12.2, platelets 144   Subjective: 1/11 overnight NP Mark Flynn ([facility]) paged to bedside. Found patient in respiratory distress, patient continued to refuse BiPAP was HFNC with SPO2 89-93%. Abdomen was distended so NGT was placed and 250 ml dark brown emesis removed. Discussion was had with family concerning moving patient toward comfort care. Patient agreed to move patient toward comfort care. Morphine PRN increased. Verified with family and patient that he would like to be made comfort care. All are in agreement.       Assessment & Plan:   Active Problems:   Chronic kidney disease, stage II (mild)   Coronary artery disease involving native coronary artery   Dementia associated with other underlying disease without behavioral disturbance   Essential hypertension   Type 2 diabetes mellitus (HCC)   Chest pain   Hyperlipidemia   Acute respiratory failure with hypoxia (HCC)   Depression   BPH (benign prostatic hyperplasia)   NSTEMI (non-ST elevated myocardial infarction) (HCC)   Acute on chronic systolic CHF (congestive heart failure), NYHA class 2 (HCC)   Acute renal failure superimposed on chronic kidney disease (HCC)   Tachypnea   DNR (do not resuscitate)   Palliative care by specialist  Altered mental status -Most likely multifactorial respiratory failure with patient refusing BiPAP (increase CO2?), Uremia,  sedating medication. -Patient was alert enough this A.m. to agree with decision to become comfort care. Currently comfortable. Daughter and son present  -1/11 initiate Comfort Care  Acute respiratory failure with hypoxia/COPD exacerbation -Acute respiratory failure most likely secondary to acute on chronic CHF. Questionable  true COPD exacerbation -CT angiogram chest negative PE see results below -DuoNeb TID -Discontinue CPAP/BIPAP -Titrate O2 to maintain  SPO2 89-93%: For comfort -1/11 initiate Comfort Care  Acute on Chronic Systolic CHF/Ischemic dilated cardiomyopathy (unknown base weight) -Strict I&O since admission +2.2 L -Daily weight Filed Weights   11/07/17 0500 11/08/17 0555 11/09/17 0500  Weight: 225 lb 15.5 oz (102.5 kg) 235 lb 0.2 oz (106.6 kg) 237 lb 7 oz (107.7 kg)  -although multivessel disease Per Dr. Calton DachMike Flynn cardiology patient not good candidate for CABG -Amiodarone drip per cardiology: Although now comfort care will wait for cardiology to see patient today to discuss discontinuing -Milrinone per cardiology:Although now comfort care will wait for cardiology to see patient today to discuss discontinuing -Coreg 3.25 mg BID -Heparin drip -See goals of care --See CKD  Pulmonary HTN -See CHF    Chest pain syndrome/CAD -HEART score  =4 Recent Labs  Lab 11/29/2017 1014 10/30/2017 1557 11/19/2017 1956  TROPONINI 0.39* 0.69* 0.47*  -Demand ischemia vs NSTEMI . Patient with cardiac catheterization showing worsening EF see results below, therefore leaning toward acute event. -See CHF    Essential Hypertension    -See CHF     Hyperlipidemia -1/8 lipid panel not within ADA/AHA guidelines.  -1/11 initiate Comfort Care   GERD, -Continue Ranexa  -1/11 initiate Comfort Care  Acute on CKD stage II (unknown baseline Cr)   Recent Labs  Lab 11/07/2017 0223 Jul 27, 2018 0025 11/07/17 0846 11/08/17 0219 11/09/17 0242  CREATININE 1.83* 1.93* 2.83* 4.56* 6.64*  -Increasing renal failure most likely multifactorial hypoperfusion secondary to patient's NSTEMI, and cardiac catheterization (dye load) -Monitor closely -Hold ACEI/ARB, secondary to worsening renal status -Hold all nephrotoxic medication -1/10 significantly worsened creatinine discontinue Lasix -1/10 Dr. Hyman Flynn Nephrology evaluated patient and agreed renal failure most likely secondary to multiple factors to include hypoperfusion from cardiac failure, contrast from cardiac  cath. Also agreed that patient was not a candidate for HD.    Type II Diabetes  controlled with peripheral neuropathy  -1/7 Hgb A1C= 6.8 -Hold all PO diabetic medication - Levemir 16 units daily  EtOH Abuse -Per EMR last drink 4 shots EtOH prior to admission -CIWA protocol   Depression -Wellbutrin 300 mg daily -Prozac 40 g daily  BPH -Proscar 5 mg daily  -Hytrin 2 mg daily  Ileus/SBO -Patient with signs and symptoms consistent with ileus/SBO. NG tube placed overnight with 250 ml dark brown emesis removed. Patient now resting comfortably     Goals of care -1/9 PALLIATIVE CARE consult: Overnight patient was made DO NOT RESUSCITATE has been informed that will most likely require CABG. Discussed short-term vs long-term goals care. Would patient even want CABG? If not transition to palliative care vs hospice -1/11 initiate Comfort Care -Will discontinue all unnecessary labs. -Start morphine drip and titrate for comfort.   DVT prophylaxis: Heparin drip Code Status: DO NOT RESUSCITATE Family Communication: Family at bedside for discussion of plan of care Disposition Plan: TBD   Consultants:  Cardiology Cardiothoracic surgery pending    Procedures/Significant Events:  1/7 Echocardiogram:Left ventricle: apex not well seen cannot r/o mural thrombus. Cavity size was severely dilated. -LVEF= 20%. Diffuse hypokinesis.  - Mitral valve: moderate regurgitation.- Left atrium: moderately  dilated. - Pulmonary arteries: PA peak pressure: 56 mm Hg (S). 1/8 Cardiac catheterization:  -Significant multivessel CAD with coronary calcification.  The LAD proximally is calcified with diffuse 50% narrowing before the first diagonal takeoff, 50% near ostial diagonal stenosis with 50% LAD stenosis immediately after the diagonal vessel followed by a longer 60 stenosis; 90% stenosis in the distal circumflex.  The circumflex immediately after its ostium gives rise to a branch to the right side of the  heart which then supplies collaterals to the distal RCA.  There is also distal circumflex collateralization to the distal RCA.  The RCA has diffuse 80% stenosis, and after acute marginal branch is totally occluded.  This is a chronic occlusion.  There are very faint collaterals from the marginal branch to the distal RCA and more extensive left-to-right collaterals supplying the distal RCA.   LVEDP 23 mm.  Previous echo documentation of EF approximately 20%.   RECOMMENDATION: Will review angiograms with colleagues.  The patient's baseline creatinine today at 1.93, gentle hydration will be administered.  The patient has severe ischemic cardiomyopathy.  Renal function will be followed closely.  Options may include surgical consultation for CABG in this patient with significant comorbidities versus PCI to at least the circumflex.  Optimal medical therapy for CAD and acute on chronic CHF.     I have personally reviewed and interpreted all radiology studies and my findings are as above.  VENTILATOR SETTINGS:    Cultures   Antimicrobials: Anti-infectives (From admission, onward)   None       Devices    LINES / TUBES:      Continuous Infusions: . sodium chloride 10 mL/hr at 11/07/17 0800  . sodium chloride    . amiodarone 30 mg/hr (11/08/17 1900)  . heparin 1,900 Units/hr (11/08/17 1900)  . milrinone 0.25 mcg/kg/min (11/08/17 1900)     Objective: Vitals:   11/09/17 0300 11/09/17 0400 11/09/17 0500 11/09/17 0600  BP: 104/70 114/68 117/75 108/64  Pulse: (!) 102 (!) 106 61 (!) 104  Resp: 17 18 16 19   Temp:  98.1 F (36.7 C)    TempSrc:  Axillary    SpO2: 97% 96% 91% 97%  Weight:   237 lb 7 oz (107.7 kg)   Height:        Intake/Output Summary (Last 24 hours) at 11/09/2017 0753 Last data filed at 11/09/2017 0600 Gross per 24 hour  Intake 1401.25 ml  Output 650 ml  Net 751.25 ml   Filed Weights   11/07/17 0500 11/08/17 0555 11/09/17 0500  Weight: 225 lb 15.5 oz  (102.5 kg) 235 lb 0.2 oz (106.6 kg) 237 lb 7 oz (107.7 kg)    Physical Exam:  General: Somnolent but arousable positive chronic respiratory distress Neck:  Negative scars, masses, torticollis, lymphadenopathy, JVD Lungs: Clear to auscultation bilaterally without wheezes or crackles Cardiovascular: Regular rate and rhythm without murmur gallop or rub normal S1 and S2 Abdomen: Morbidly obese, negative  abdominal pain, positive distention, positive soft, bowel sounds, no rebound, no ascites, no appreciable mass Extremities: No significant cyanosis, clubbing, or edema bilateral lower extremities Skin: Negative rashes, lesions, ulcers Psychiatric:  Negative depression, negative anxiety, negative fatigue, negative mania  Central nervous system:  Cranial nerves II through XII intact, tongue/uvula midline, all extremities muscle strength 5/5, sensation intact throughout, negative dysarthria, negative expressive aphasia, negative receptive aphasia.     Data Reviewed: Care during the described time interval was provided by me .  I have reviewed this patient's available  data, including medical history, events of note, physical examination, and all test results as part of my evaluation.   CBC: Recent Labs  Lab 11/22/2017 0223 11/01/2017 0025 11/07/17 0846 11/08/17 0219 11/09/17 0242  WBC 9.4 9.5 9.1 9.0 9.3  HGB 12.2* 11.5* 10.4* 9.7* 9.2*  HCT 36.4* 34.4* 31.8* 30.2* 27.8*  MCV 91.0 90.3 91.9 91.8 90.6  PLT 144* 134* 127* 127* 153   Basic Metabolic Panel: Recent Labs  Lab 11/07/2017 0223 11/18/2017 0025 11/07/17 0846 11/08/17 0219 11/09/17 0242  NA 137 136 133* 132* 128*  K 3.8 3.4* 4.0 4.2 3.8  CL 105 101 94* 93* 88*  CO2 21* 24 24 23  20*  GLUCOSE 291* 206* 271* 256* 196*  BUN 32* 32* 44* 70* 100*  CREATININE 1.83* 1.93* 2.83* 4.56* 6.64*  CALCIUM 7.9* 8.0* 8.1* 7.9* 7.5*  MG  --   --  2.0 2.1 2.1   GFR: Estimated Creatinine Clearance: 11.6 mL/min (A) (by C-G formula based on SCr of  6.64 mg/dL (H)). Liver Function Tests: Recent Labs  Lab 11/07/17 0846 11/08/17 0219  AST 28 39  ALT 20 35  ALKPHOS 83 72  BILITOT 1.1 0.9  PROT 6.9 6.4*  ALBUMIN 3.6 3.4*   No results for input(s): LIPASE, AMYLASE in the last 168 hours. No results for input(s): AMMONIA in the last 168 hours. Coagulation Profile: Recent Labs  Lab 11/02/2017 1014  INR 0.98   Cardiac Enzymes: Recent Labs  Lab 11/03/2017 1014 11/03/2017 1557 11/13/2017 1956  TROPONINI 0.39* 0.69* 0.47*   BNP (last 3 results) No results for input(s): PROBNP in the last 8760 hours. HbA1C: No results for input(s): HGBA1C in the last 72 hours. CBG: Recent Labs  Lab 11/07/17 2134 11/08/17 0758 11/08/17 1225 11/08/17 1604 11/08/17 2110  GLUCAP 214* 262* 201* 183* 189*   Lipid Profile: No results for input(s): CHOL, HDL, LDLCALC, TRIG, CHOLHDL, LDLDIRECT in the last 72 hours. Thyroid Function Tests: No results for input(s): TSH, T4TOTAL, FREET4, T3FREE, THYROIDAB in the last 72 hours. Anemia Panel: Recent Labs    11/08/17 1449  TIBC 311  IRON 21*   Urine analysis: No results found for: COLORURINE, APPEARANCEUR, LABSPEC, PHURINE, GLUCOSEU, HGBUR, BILIRUBINUR, KETONESUR, PROTEINUR, UROBILINOGEN, NITRITE, LEUKOCYTESUR Sepsis Labs: @LABRCNTIP (procalcitonin:4,lacticidven:4)  ) Recent Results (from the past 240 hour(s))  MRSA PCR Screening     Status: None   Collection Time: 11/26/2017  3:20 PM  Result Value Ref Range Status   MRSA by PCR NEGATIVE NEGATIVE Final    Comment:        The GeneXpert MRSA Assay (FDA approved for NASAL specimens only), is one component of a comprehensive MRSA colonization surveillance program. It is not intended to diagnose MRSA infection nor to guide or monitor treatment for MRSA infections.          Radiology Studies: US Renal  Result Date: 11/08/2017 CLINICAL DATA:  Acute on chronic renal failure, history diabetes mellitus, hypertension, COPD, coronary artery  disease EXAM: RENAL / URINARY TRACT ULTRASOUND COMPLETE COMPARISON:  CT abdomen pelvis 09/28/2014 FINDINGS: Right Kidney: Length: 10.0 cm. Cortical thinning. Normal cortical echogenicity. No mass, hydronephrosis or shadowing calcification Left Kidney: Length: 10.8 cm. Cortical thinning. Increased cortical echogenicity. No mass, hydronephrosis or shadowing calcification Bladder: Decompressed, unable to evaluate IMPRESSION: Cortical thinning of both kidneys without evidence of mass or hydronephrosis. Electronically Signed   By: Ulyses Southward M.D.   On: 11/08/2017 15:08   Dg Abd Portable 1v  Result Date: 11/09/2017 CLINICAL DATA:  82 year old male with NG tube placement. EXAM: PORTABLE ABDOMEN - 1 VIEW COMPARISON:  Radiograph dated 09/30/2014 FINDINGS: An enteric tube is partially visualized with tip over the upper lumbar spine likely in the distal stomach. Nondilated air-filled loops of small and large bowel in the visualized upper abdomen IMPRESSION: Enteric tube with tip likely in the distal stomach. Electronically Signed   By: Elgie Collard M.D.   On: 11/09/2017 01:50        Scheduled Meds: . aspirin  81 mg Oral Daily  . atorvastatin  80 mg Oral q1800  . buPROPion  300 mg Oral Daily  . carvedilol  3.125 mg Oral BID WC  . finasteride  5 mg Oral Daily  . FLUoxetine  40 mg Oral Daily  . folic acid  1 mg Oral Daily  . gabapentin  300 mg Oral BID  . insulin aspart  0-15 Units Subcutaneous Q4H  . insulin detemir  16 Units Subcutaneous QHS  . ipratropium-albuterol  3 mL Nebulization Q4H  . mouth rinse  15 mL Mouth Rinse BID  . multivitamin with minerals  1 tablet Oral Daily  . pantoprazole  40 mg Oral Daily  . ranolazine  500 mg Oral QPM  . sodium chloride flush  3 mL Intravenous Q12H  . tamsulosin  0.4 mg Oral Daily  . terazosin  2 mg Oral QHS  . thiamine  100 mg Oral Daily  . Vitamin D (Ergocalciferol)  50,000 Units Oral Q7 days   Continuous Infusions: . sodium chloride 10 mL/hr at  11/07/17 0800  . sodium chloride    . amiodarone 30 mg/hr (11/08/17 1900)  . heparin 1,900 Units/hr (11/08/17 1900)  . milrinone 0.25 mcg/kg/min (11/08/17 1900)     LOS: 4 days    Time spent: 40 minutes    Shaquaya Wuellner, Roselind Messier, MD [facility] Pager [phone]   If 7PM-7AM, please contact night-coverage [email or web address] Password [facility] 11/09/2017, 7:53 AM

## 2017-11-09 NOTE — Progress Notes (Signed)
Received a call from the patient's nurse that he has transitioned to full comfort measures. Chart reviewed. Pt with progressive multiorgan failure and decreased responsiveness. Will stop milrinone and amiodarone infusions.   Tonny BollmanMichael Ngozi Alvidrez 11/09/2017 10:55 AM

## 2017-11-09 NOTE — Progress Notes (Signed)
Call C. Bodenheimer NP to discuss pt status, work of breathing, wheezing, air hunger.  Orders received and carried out.  Will continue to monitor.

## 2017-11-09 NOTE — Progress Notes (Signed)
Pt continues to cough, RR 20's sats low 90's on 10 L HFNC.  Pt refuses Bipap, Morphine given for respiratory demands.  Family at bedside.  Pt vomiting bilious emesis.  Zofran and phenergan given with no results.

## 2017-11-09 NOTE — Progress Notes (Signed)
Pt given Robinol for secretions, pt able to cough but unable to cough up secretions. 2mg  morphine given for work of breathing.

## 2017-11-09 NOTE — Progress Notes (Signed)
   11/09/17 1000  Clinical Encounter Type  Visited With Patient and family together;Health care provider  Visit Type Initial  Referral From Physician  Consult/Referral To Chaplain  Spiritual Encounters  Spiritual Needs Prayer  Stress Factors  Family Stress Factors Major life changes   Responded to a [facility] for Palliative, comfort care.  Two daughters present.  They shared about their dads life and understand he does not have much time.  We prayed together.  Offered empathy and compassion and reassured them it is ok to talk to him and let him know they are here.  Will continue to follow as needed. Chaplain Agustin CreeNewton Oval Moralez

## 2017-11-09 NOTE — Progress Notes (Signed)
Daily Progress Note   Patient Name: Mark Flynn       Date: 11/09/2017 DOB: 09-Jun-1936  Age: 82 y.o. MRN#: [id number] Attending Physician: Drema Dallas, MD Primary Care Physician: Anselmo Pickler, MD Admit Date: 11/19/2017  Reason for Consultation/Follow-up: Terminal Care  Subjective:  not alert, appears congested, appears weak Has generalized edema Overnight events noted  2 daughters at bedside See below  Length of Stay: 4  Current Medications: Scheduled Meds:  . aspirin  81 mg Oral Daily  . atorvastatin  80 mg Oral q1800  . buPROPion  300 mg Oral Daily  . carvedilol  3.125 mg Oral BID WC  . finasteride  5 mg Oral Daily  . FLUoxetine  40 mg Oral Daily  . folic acid  1 mg Oral Daily  . gabapentin  300 mg Oral BID  . insulin detemir  16 Units Subcutaneous QHS  . ipratropium-albuterol  3 mL Nebulization Q4H  . mouth rinse  15 mL Mouth Rinse BID  . multivitamin with minerals  1 tablet Oral Daily  . pantoprazole  40 mg Oral Daily  . ranolazine  500 mg Oral QPM  . sodium chloride flush  3 mL Intravenous Q12H  . tamsulosin  0.4 mg Oral Daily  . terazosin  2 mg Oral QHS  . thiamine  100 mg Oral Daily  . Vitamin D (Ergocalciferol)  50,000 Units Oral Q7 days    Continuous Infusions: . sodium chloride 10 mL/hr at 11/09/17 0800  . sodium chloride    . amiodarone 30 mg/hr (11/09/17 0800)  . heparin 1,900 Units/hr (11/09/17 0800)  . HYDROmorphone    . milrinone 0.25 mcg/kg/min (11/08/17 1900)  . morphine      PRN Meds: sodium chloride, acetaminophen **OR** acetaminophen, albuterol, antiseptic oral rinse, benzonatate, dextromethorphan-guaiFENesin, diazepam, gi cocktail, glycopyrrolate, haloperidol lactate, HYDROmorphone **AND** HYDROmorphone (DILAUDID) injection,  LORazepam, morphine injection, nitroGLYCERIN, ondansetron **OR** ondansetron (ZOFRAN) IV, polyvinyl alcohol, promethazine, sodium chloride flush  Physical Exam         Not awake not alert In mild to moderate resp distress Coarse breath sounds Has dark bilious material from NGT Abdomen distended Has edema generalized S1 S2 Monitor noted  Vital Signs: BP (!) 93/55 (BP Location: Right Arm)   Pulse (!) 107   Temp 100.2 F (37.9 C) (Axillary)  Resp 20   Ht 6\' 1"  (1.854 m)   Wt 107.7 kg (237 lb 7 oz)   SpO2 98%   BMI 31.33 kg/m  SpO2: SpO2: 98 % O2 Device: O2 Device: (hfnc) O2 Flow Rate: O2 Flow Rate (L/min): 14 L/min  Intake/output summary:   Intake/Output Summary (Last 24 hours) at 11/09/2017 0951 Last data filed at 11/09/2017 0800 Gross per 24 hour  Intake 1508.65 ml  Output 650 ml  Net 858.65 ml   LBM: Last BM Date: 11/25/2017 Baseline Weight: Weight: 102.1 kg (225 lb) Most recent weight: Weight: 107.7 kg (237 lb 7 oz)       Palliative Assessment/Data: PPS 20%     Patient Active Problem List   Diagnosis Date Noted  . Acute renal failure superimposed on chronic kidney disease (HCC)   . Tachypnea   . DNR (do not resuscitate)   . Palliative care by specialist   . Acute on chronic systolic CHF (congestive heart failure), NYHA class 2 (HCC)   . Chest pain 11/13/2017  . Hyperlipidemia 11/12/2017  . Acute respiratory failure with hypoxia (HCC) 11/08/2017  . Depression 11/14/2017  . BPH (benign prostatic hyperplasia) 11/20/2017  . NSTEMI (non-ST elevated myocardial infarction) (HCC) 11/08/2017  . Acute pulmonary edema (HCC)   . Dementia associated with other underlying disease without behavioral disturbance 04/25/2015  . Chronic kidney disease, stage II (mild) 04/23/2015  . Coronary artery disease involving native coronary artery 04/23/2015  . Essential hypertension 04/23/2015  . Type 2 diabetes mellitus (HCC) 04/23/2015    Palliative Care Assessment & Plan    Patient Profile:    Assessment: Cardio renal syndrome Multi organ failure Atrial flutter Decompensated acute on chronic systolic CHF AKI on stage III-IV CKD Ileus/bowel obstruction overnight  Transitioning towards the end of life Resp distress  Recommendations/Plan:   comfort measures only  D/C blood sugar checks  Dilaudid drip for comfort as well as option for bolus Dilaudid IV PRN  Ativan IV PRN for anxiety, agitation or rest less ness at end of life  Agree with D/C Heparin drip  Chaplain consult for supporting family at end of life  Comfort cart for family  Anticipated hospital death: prognosis as short as few hours to as long as few days frankly and compassionately discussed with family.    Code Status:    Code Status Orders  (From admission, onward)        Start     Ordered   11/09/17 0941  Do not attempt resuscitation (DNR)  Continuous    Question Answer Comment  In the event of cardiac or respiratory ARREST Do not call a "code blue"   In the event of cardiac or respiratory ARREST Do not perform Intubation, CPR, defibrillation or ACLS   In the event of cardiac or respiratory ARREST Use medication by any route, position, wound care, and other measures to relive pain and suffering. May use oxygen, suction and manual treatment of airway obstruction as needed for comfort.      11/09/17 0947    Code Status History    Date Active Date Inactive Code Status Order ID Comments User Context   11/09/2017 23:34 11/09/2017 09:47 DNR [id number]  Leda GauzeKirby-Graham, Karen J, NP Inpatient   11/15/2017 07:56 11/14/2017 23:34 Full Code [id number]  Marcos EkeWertman, Sara E, PA-C ED       Prognosis:   Hours - Days  Discharge Planning:  Anticipated Hospital Death  Care plan was discussed with  RN, both daughters.  Thank  you for allowing the Palliative Medicine Team to assist in the care of this patient.   Time In: 9 Time Out: 9.35 Total Time 35  Prolonged Time Billed  no        Greater than 50%  of this time was spent counseling and coordinating care related to the above assessment and plan.  Rosalin Hawking, MD [phone] Please contact Palliative Medicine Team phone at [phone] for questions and concerns.

## 2017-11-09 NOTE — Progress Notes (Signed)
Nutrition Brief Note  Chart reviewed. Pt now transitioning to comfort care.  No further nutrition interventions warranted at this time.  Please re-consult as needed.   Areatha Kalata A. Dmarius Reeder, RD, LDN, CDE Pager: [phone] After hours Pager: [phone]  

## 2017-11-09 NOTE — Progress Notes (Addendum)
Dilaudid gtt started per palliative. Heparin, amio and milrinone stopped per verbal order from Dr Excell Seltzerooper.  Comfort Cart ordered and delivered to  pt room for family.

## 2017-11-09 NOTE — Progress Notes (Signed)
Pt audibly wheezing, RR 20's, use of accessory muscles noted.  Pt complains of hacking non productive cough.  Clance Boll. Bodenheimer paged and order received for Tesalon pearls.  Given as ordered.

## 2017-11-09 NOTE — Care Management Note (Signed)
Case Management Note  Patient Details  Name: Mark Flynn MRN: [id number] Date of Birth: [birth year]  Subjective/Objective:       Pt admitted with CP and SOB             Action/Plan:  Pt is from home and independent with ADLs.  Pt requiring Nitro drip with sustained CP - plan for urgent  cardiac cath 11/01/2017.  CM will continue to follow for discharge needs   Expected Discharge Date:                  Expected Discharge Plan:     In-House Referral:     Discharge planning Services  CM Consult  Post Acute Care Choice:    Choice offered to:     DME Arranged:    DME Agency:     HH Arranged:    HH Agency:     Status of Service:     If discussed at [employer] Meetings, dates discussed:    Additional Comments: 11/09/2017 Pt has now transitioned into full comfort care Mark Flynn, Mark Traum S, RN 11/09/2017, 3:56 PM

## 2017-11-09 NOTE — Progress Notes (Signed)
Paged by bedside RN who is concerned about the pt vomiting a dark brown emesis. As well as his 02 demands, resp status, and overall prognosis. Family at the bedside is also concerned about pts overall prognosis. Upon entering the room the pt is in resp distress with accessory muscle use, audible wheezing and rhonchi. He is confused and refuses to wear Bipap but wearing HFNC. 02 sats were 90-93% at the time. Pt has also had multiple episodes of emesis and his stomach is taut and distended. An NG was placed and approximately 249ms of dark brown emesis was removed. An Abd X-ray was ordered to confirm placement.    Spoke with family at bedside regarding the pts poor prognosis and goals of care. Family states they are aware of the pts poor prognosis and that his condition is worsening. Discussed with them moving more towards making him comfortable and not progressing care. Family is agreeable to start moving towards comfort/palliative care. The family met with palliative earlier in the day as well regarding prognosis and goals of care.  Pt is already a DNR. I will increase his morphine to 266mIV Q2H and D/C his Ativan since the family states it makes him confused. Will also increase his Duonebs to Q4H. Family has agreed to keep the NG tube in place to help with his emesis and hopefully provide some increased comfort. Encouraged family to let nursing staff know if they have any other concerns or needs.   ChArby BarretteP-C, AGPCNP-BC [facility] Pager [phone]

## 2017-11-09 NOTE — Progress Notes (Signed)
Pt vomiting dark brown thick fluid.  RR 20-30's.  Sats low 90.  Pt anxious, skin tone grey.  RN spoke with pt daughters and discussed prognosis and current care options.   Clance Boll. Bodenheimer NP paged and requested to come bedside to speak with family.  NGT placed right nare without difficulty, immediate gastric fluid out of NG.  Confirmed placement and placed to intermittent low wall suction.  KUB ordered.  NG immediately drained 250 cc of thick dark brown gastric content.  After placement of NG, pt more relaxed.  RR 18, sats 95%, wheeze less pronouced.  Pt appears more comfortable.  Family remained bedside and have no questions at this time.  Goal is to keep pt comfortable.

## 2017-11-10 MED ORDER — GLYCOPYRROLATE 0.2 MG/ML IJ SOLN
0.3000 mg | Freq: Three times a day (TID) | INTRAMUSCULAR | Status: DC | PRN
  Administered 2017-11-10: 0.3 mg via INTRAVENOUS
  Filled 2017-11-10: qty 2

## 2017-11-10 MED ORDER — MORPHINE SULFATE (PF) 4 MG/ML IV SOLN
4.0000 mg | Freq: Once | INTRAVENOUS | Status: AC
  Administered 2017-11-10: 4 mg via INTRAVENOUS
  Filled 2017-11-10: qty 1

## 2017-11-10 MED ORDER — IPRATROPIUM-ALBUTEROL 0.5-2.5 (3) MG/3ML IN SOLN: 3.0000 mL | Freq: Once | RESPIRATORY_TRACT | Status: DC

## 2017-11-30 NOTE — Progress Notes (Signed)
Attempts to pass catheter down nose was unsuccessful. Tried to suction suction back of throat with yanker and did not get anything.

## 2017-11-30 NOTE — Progress Notes (Signed)
NT suctioned patient per family request.  Unable to pass catheter down L nare. Passed through R nare with moderate amount of thin yellow tan secretions removed.  Patient has no cough effort.  Also suctioned orally for same thin yellow tan secretions. Unable to completely clear the secretions from airway, patient still has "gurgle".  Patient was comfortable throughout, no bleeding, sats fell during procedure returning to mid 90's post procedure.

## 2017-11-30 NOTE — Progress Notes (Signed)
Wasted 50ml dilaudid with Westley GamblesHye Suk RN in sink.

## 2017-11-30 NOTE — Discharge Summary (Signed)
Death Summary  Mark Flynn ZOX:[id number] DOB: Apr 16, 1936 DOA: November 09, 2017  PCP: Anselmo Pickler, MD PCP/Office notified:   Admit date: 2017-11-09 Date of Death: 11-14-17  Final Diagnoses:  Active Problems:   Chronic kidney disease, stage II (mild)   Coronary artery disease involving native coronary artery   Dementia associated with other underlying disease without behavioral disturbance   Essential hypertension   Type 2 diabetes mellitus (HCC)   Chest pain   Hyperlipidemia   Acute respiratory failure with hypoxia (HCC)   Depression   BPH (benign prostatic hyperplasia)   NSTEMI (non-ST elevated myocardial infarction) (HCC)   Acute on chronic systolic CHF (congestive heart failure), NYHA class 2 (HCC)   Acute renal failure superimposed on chronic kidney disease (HCC)   Tachypnea   DNR (do not resuscitate)   Palliative care by specialist    Altered mental status -Most likely multifactorial respiratory failure with patient refusing BiPAP (increase CO2?), Uremia, sedating medication. -Patient was alert enough this A.m. to agree with decision to become comfort care. Currently comfortable. Daughter and son present  -1/11 initiate Comfort Care   Acute respiratory failure with hypoxia/COPD exacerbation -Acute respiratory failure most likely secondary to acute on chronic CHF. Questionable  true COPD exacerbation -CT angiogram chest negative PE see results below -DuoNeb TID -Discontinue CPAP/BIPAP -Titrate O2 to maintain SPO2 89-93%: For comfort -1/11 initiate Comfort Care   Acute on Chronic Systolic CHF/Ischemic dilated cardiomyopathy (unknown base weight) -Strict I&O since admission +2.2 L -Daily weight      Filed Weights    11/07/17 0500 11/08/17 0555 11/09/17 0500  Weight: 225 lb 15.5 oz (102.5 kg) 235 lb 0.2 oz (106.6 kg) 237 lb 7 oz (107.7 kg)  -although multivessel disease Per Dr. Calton Dach cardiology patient not good candidate for CABG -Amiodarone drip per  cardiology: Although now comfort care will wait for cardiology to see patient today to discuss discontinuing -Milrinone per cardiology:Although now comfort care will wait for cardiology to see patient today to discuss discontinuing -Coreg 3.25 mg BID -Heparin drip -See goals of care --See CKD   Pulmonary HTN -See CHF     Chest pain syndrome/CAD -HEART score  =4 Last Labs        Recent Labs  Lab Nov 09, 2017 1014 November 09, 2017 1557 Nov 09, 2017 1956  TROPONINI 0.39* 0.69* 0.47*    -Demand ischemia vs NSTEMI . Patient with cardiac catheterization showing worsening EF see results below, therefore leaning toward acute event. -See CHF   Essential Hypertension    -See CHF     Hyperlipidemia -1/8 lipid panel not within ADA/AHA guidelines.  -1/11 initiate Comfort Care     GERD, -Continue Ranexa  -1/11 initiate Comfort Care   Acute on CKD stage II (unknown baseline Cr)   Last Labs          Recent Labs  Lab 11/09/17 0223 11/01/2017 0025 11/07/17 0846 11/08/17 0219 11/09/17 0242  CREATININE 1.83* 1.93* 2.83* 4.56* 6.64*    -Increasing renal failure most likely multifactorial hypoperfusion secondary to patient's NSTEMI, and cardiac catheterization (dye load) -Monitor closely -Hold ACEI/ARB, secondary to worsening renal status -Hold all nephrotoxic medication -1/10 significantly worsened creatinine discontinue Lasix -1/10 Dr. Hyman Hopes Nephrology evaluated patient and agreed renal failure most likely secondary to multiple factors to include hypoperfusion from cardiac failure, contrast from cardiac cath. Also agreed that patient was not a candidate for HD.    Type II Diabetes  controlled with peripheral neuropathy  -Nov 09, 2022 Hgb A1C= 6.8 -Hold all PO diabetic medication -  Levemir 16 units daily   EtOH Abuse -Per EMR last drink 4 shots EtOH prior to admission -CIWA protocol   Depression -Wellbutrin 300 mg daily -Prozac 40 g daily   BPH -Proscar 5 mg daily  -Hytrin 2 mg daily    Ileus/SBO -Patient with signs and symptoms consistent with ileus/SBO. NG tube placed overnight with 250 ml dark brown emesis removed. Patient now resting comfortably         History of present illness:  82 y.o. WM PMHx COPD, Diabetes Type II controlled with complication, HLD, HTN, CAD  S[facility] about 3 years ago, which showed "some blockages, not stented, treated medically ",    Presented to the emergency department with acute onset of chest pain while at rest last night.  He took several sublingual nitroglycerin without relief, as well as aspirin 324 mg on transit, with an extra dose of nitroglycerin, with some better control of his symptoms.  He states that on exertion, on with movement, this chest pain is also present.  He denies any prior symptoms as described.  The patient also reports ongoing dyspnea for the last 3 months, worse on exertion.  He is not on oxygen at home.  He recently quit tobacco about 1 week ago.  He denies any trauma to the chest.  He denies any recent long distance trips. No history of PE or DVT . He denies fevers, chills, night sweats or sick contacts.  He reports Increasing abdominal girth despite taking more Lasix.  The patient denies any nausea or vomiting.  He denies any urine retention  He does report intermittent lower extremity swelling, improved with Lasix.  He denies any calf pain. Patient drinks approximately 3-4 shots liquor a night. Denies recreational drug use.    During his hospitalization patient found to have multivessel cardiac disease, patient not candidate for CABG. Patient's physical condition continued to deteriorate with acute on chronic renal failure secondary to hypoperfusion of kidneys due to NSTEMI resulting in cardiorenal syndrome patient not candidate for HD. Patient expired peacefully A.m. 11/09/2017   Time: A.m./11/24/2017  Signed:  Carolyne LittlesWOODS, CURTIS J  [facility] 11/14/2017, 6:52 AM

## 2017-11-30 DEATH — deceased

## 2018-06-26 IMAGING — US US RENAL
1 series · 14 of 21 positions shown · non-contrast
Comparison: CT abdomen pelvis 09/28/2014

CLINICAL DATA: Acute on chronic renal failure, history diabetes
mellitus, hypertension, COPD, coronary artery disease

EXAM:
RENAL / URINARY TRACT ULTRASOUND COMPLETE

[Series 1: us renal · 0.34mm/px · 14 of 21 slices shown]
[im 1/21]
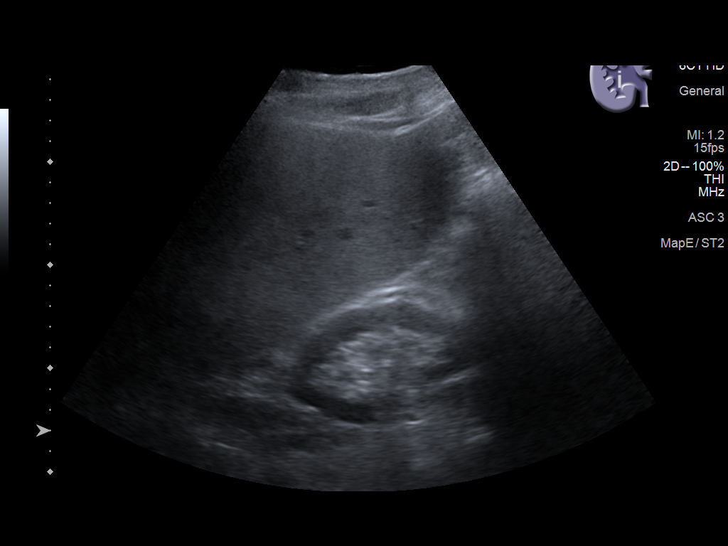
[im 3/21]
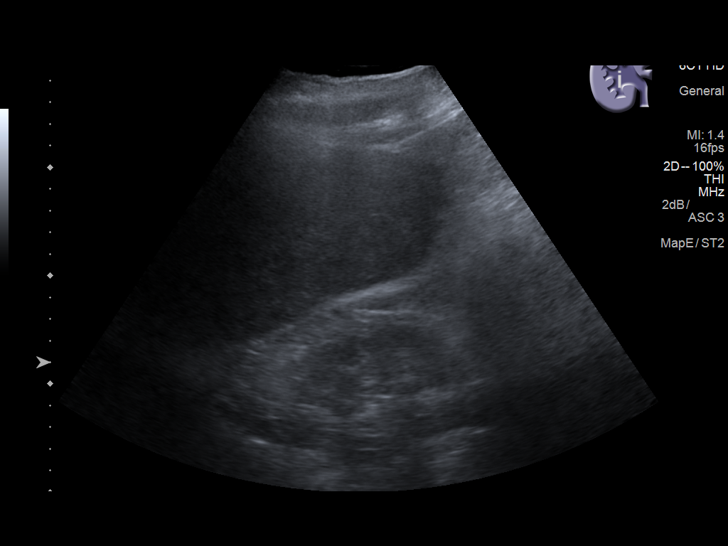
[im 4/21]
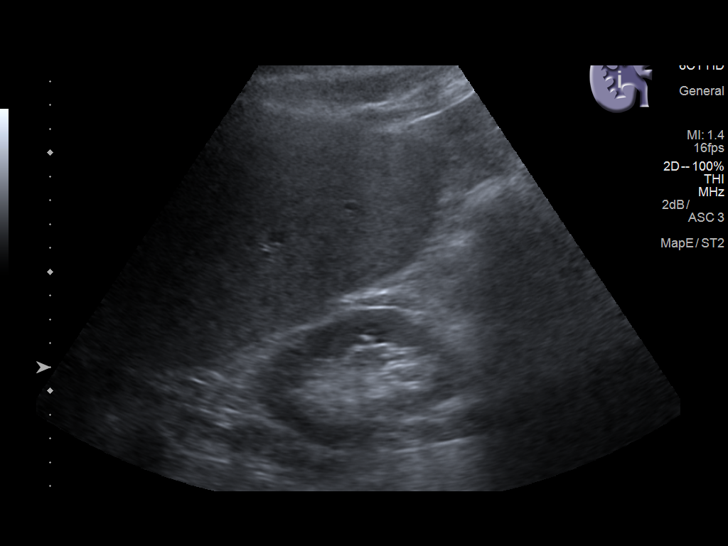
[im 6/21]
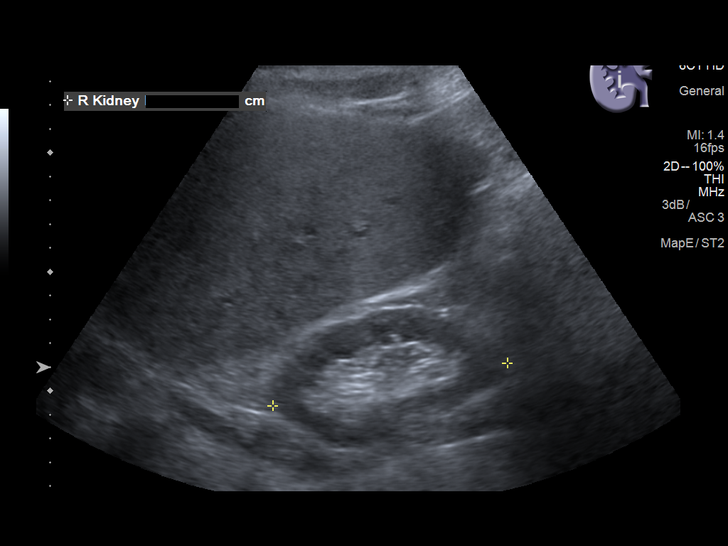
[im 7/21]
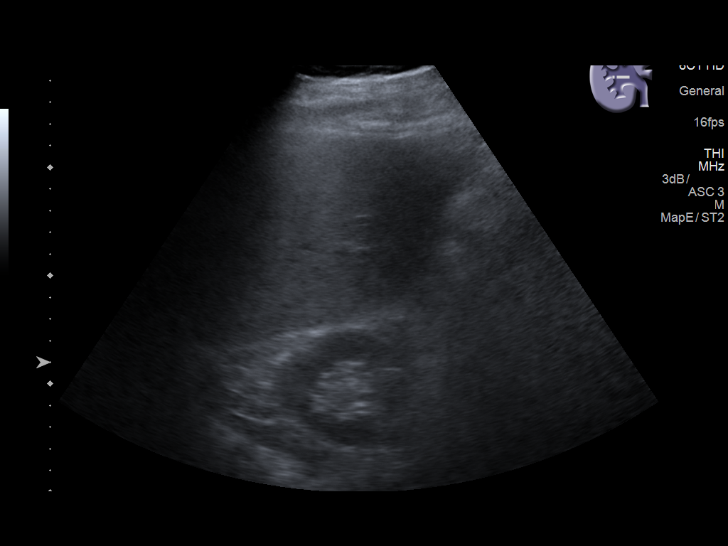
[im 9/21]
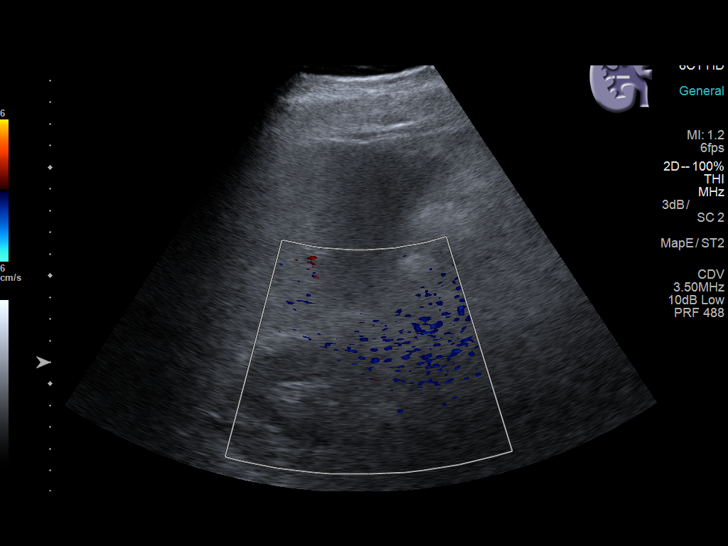
[im 10/21]
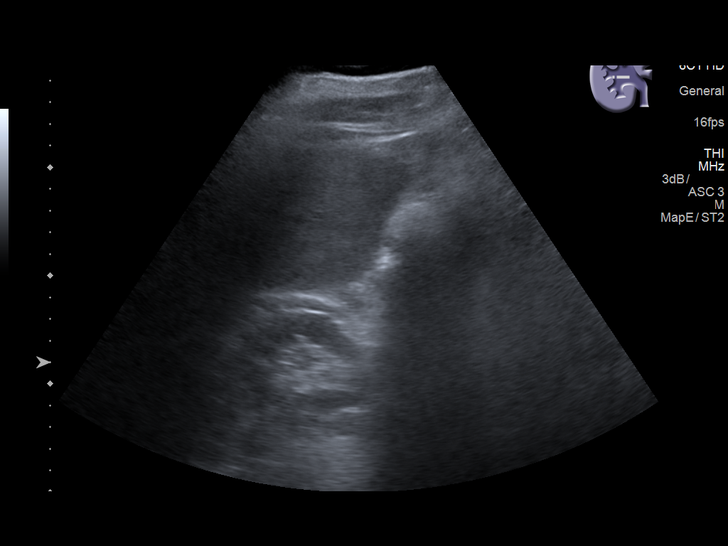
[im 12/21]
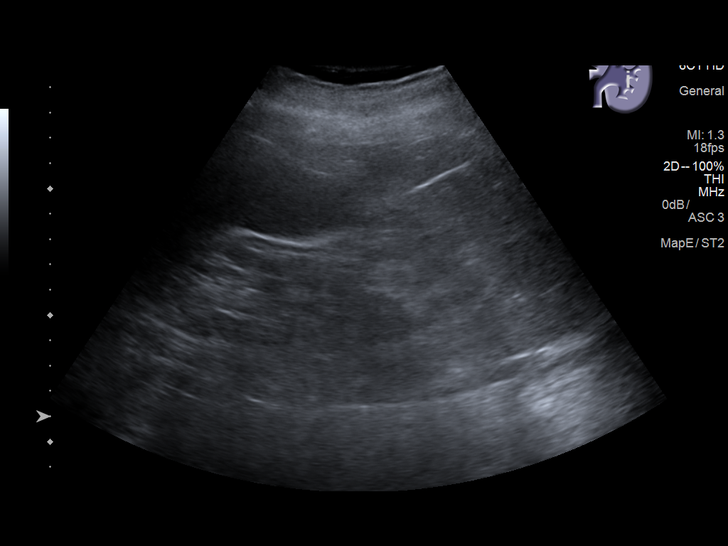
[im 13/21]
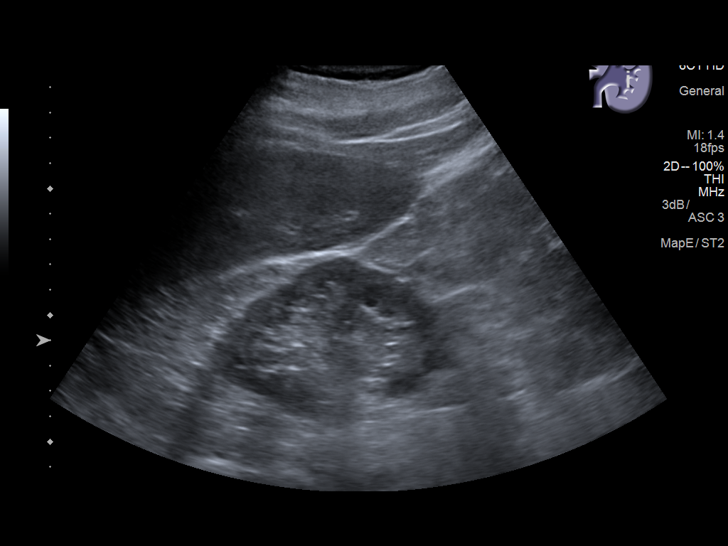
[im 15/21]
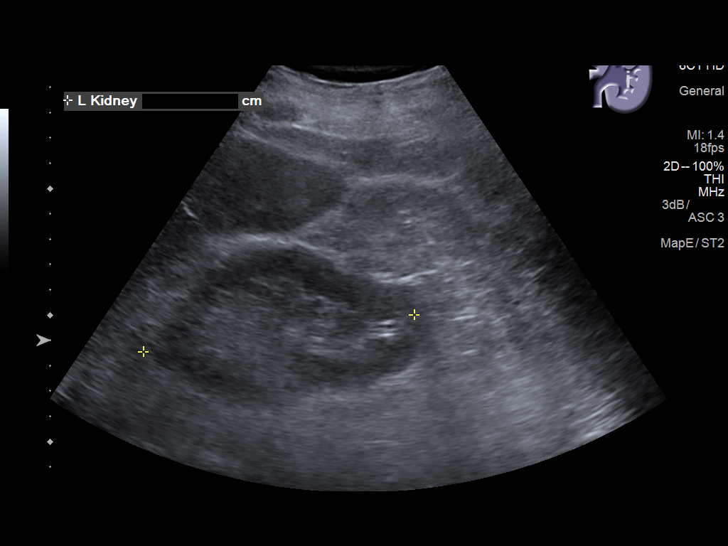
[im 16/21]
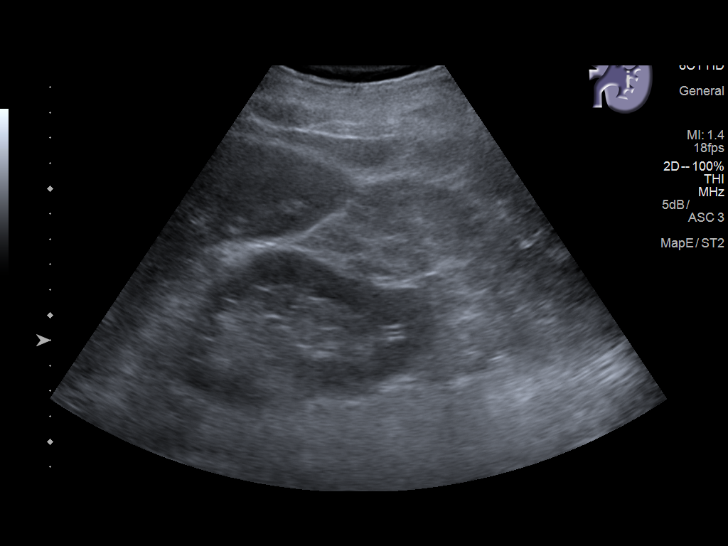
[im 18/21]
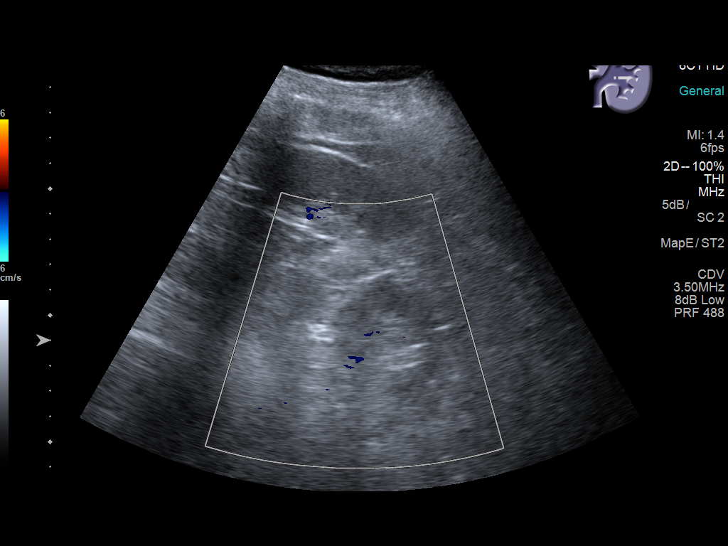
[im 19/21]
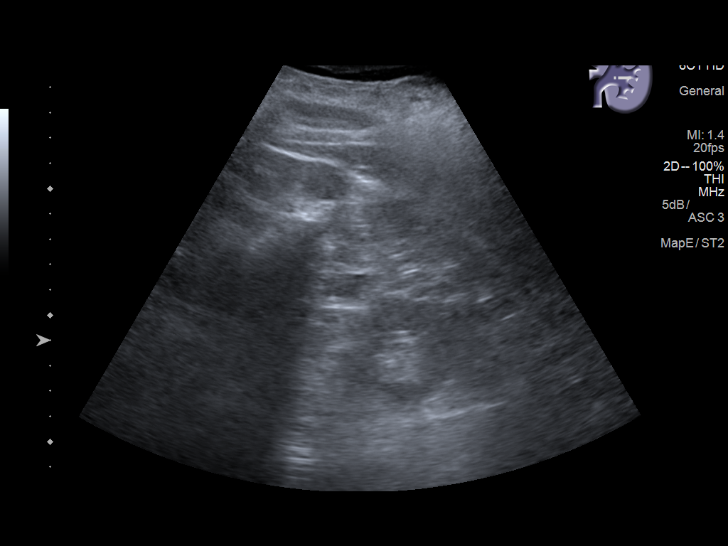
[im 21/21]
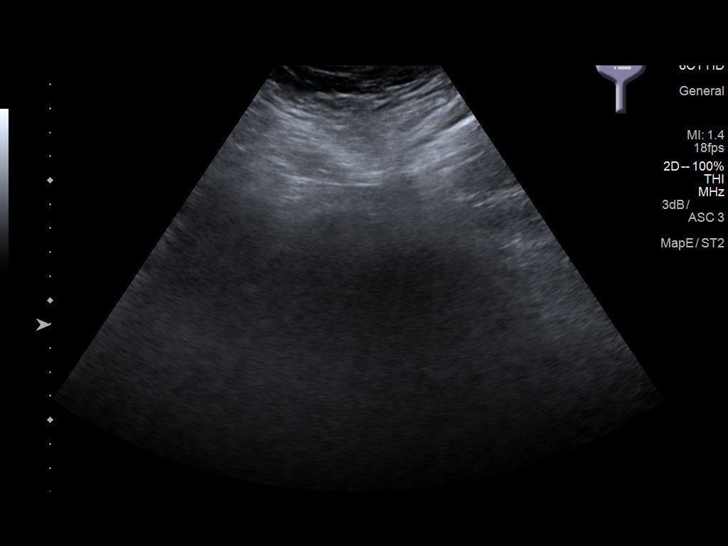

[14 of 21 positions shown; findings below may reference images not displayed]

FINDINGS: Right Kidney:

Length: 10.0 cm. Cortical thinning. Normal cortical echogenicity. No
mass, hydronephrosis or shadowing calcification

Left Kidney:

Length: 10.8 cm. Cortical thinning. Increased cortical echogenicity.
No mass, hydronephrosis or shadowing calcification

Bladder:

Decompressed, unable to evaluate
IMPRESSION: Cortical thinning of both kidneys without evidence of mass or
hydronephrosis.

## 2019-10-01 IMAGING — DX DG ABD PORTABLE 1V
2 series · 2 of 2 positions shown · non-contrast
Comparison: Radiograph dated 09/30/2014

CLINICAL DATA: 81-year-old male with NG tube placement.

EXAM:
PORTABLE ABDOMEN - 1 VIEW

[abdomen kub (1 of 2)]
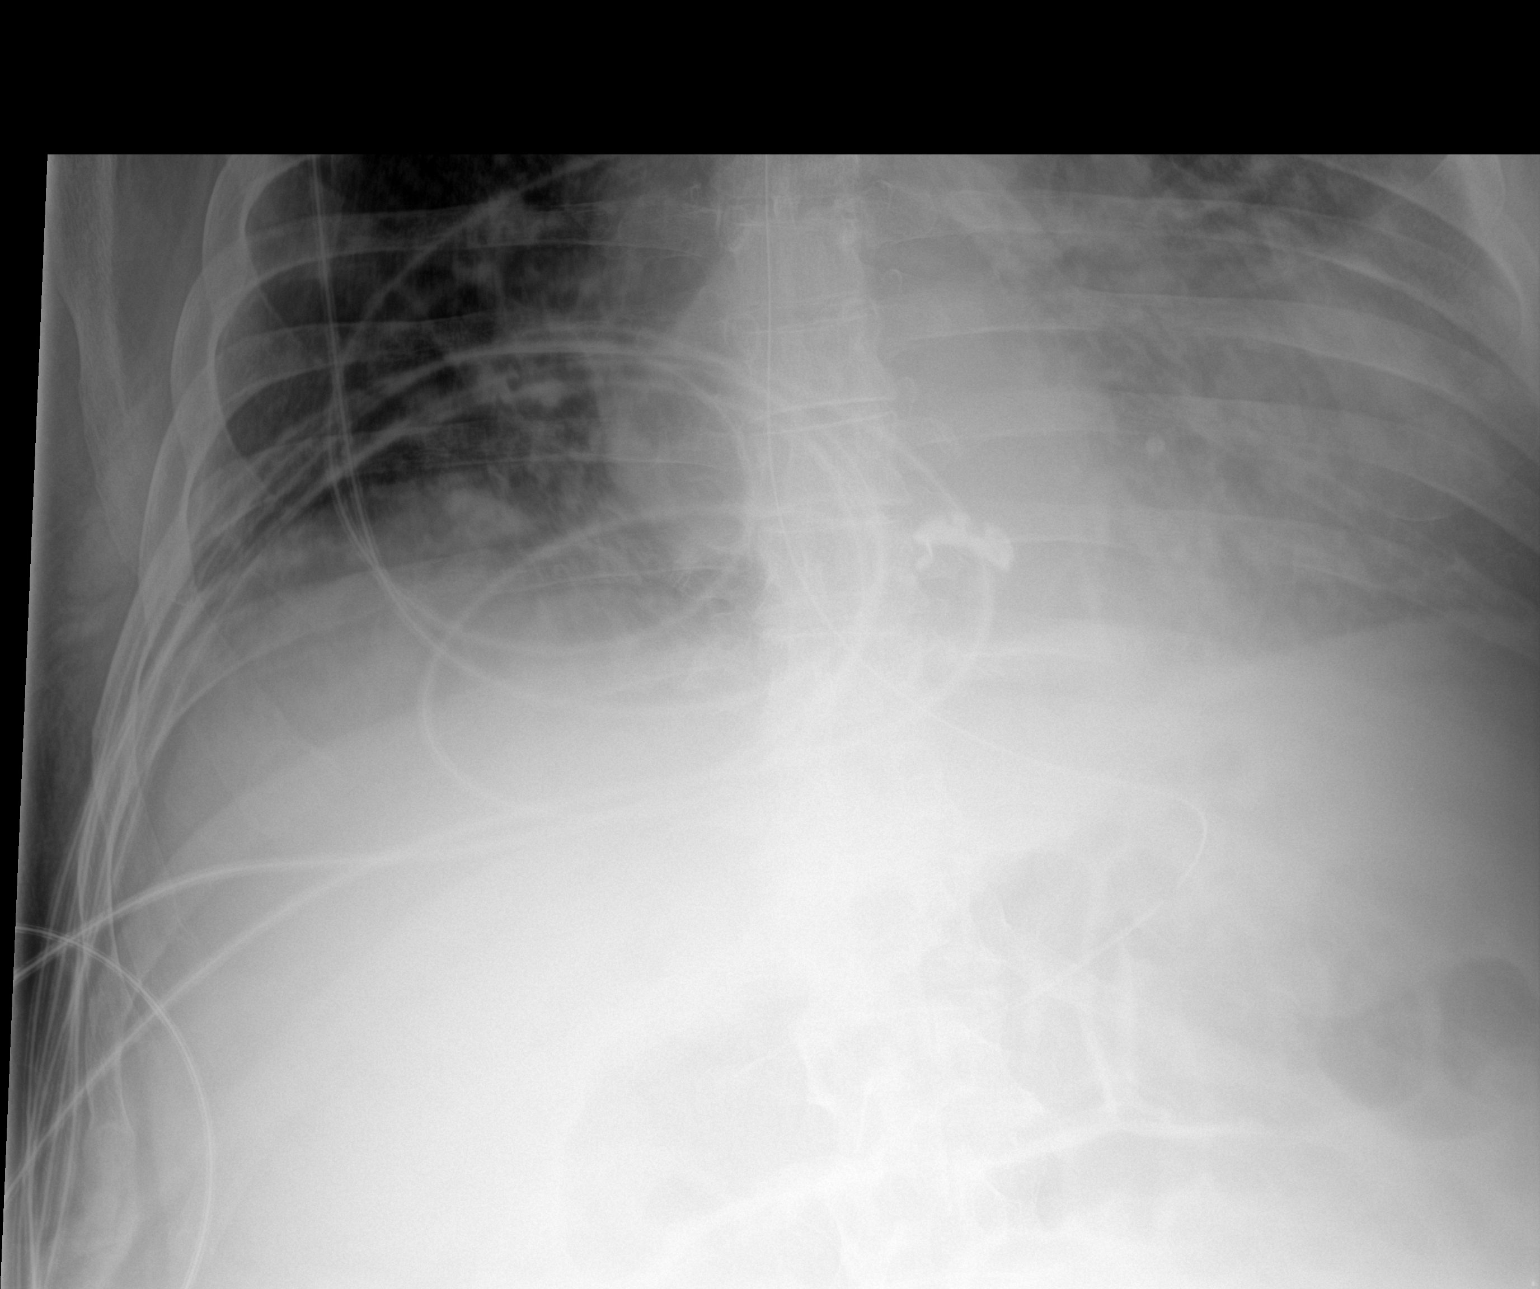

[abdomen kub (2 of 2)]
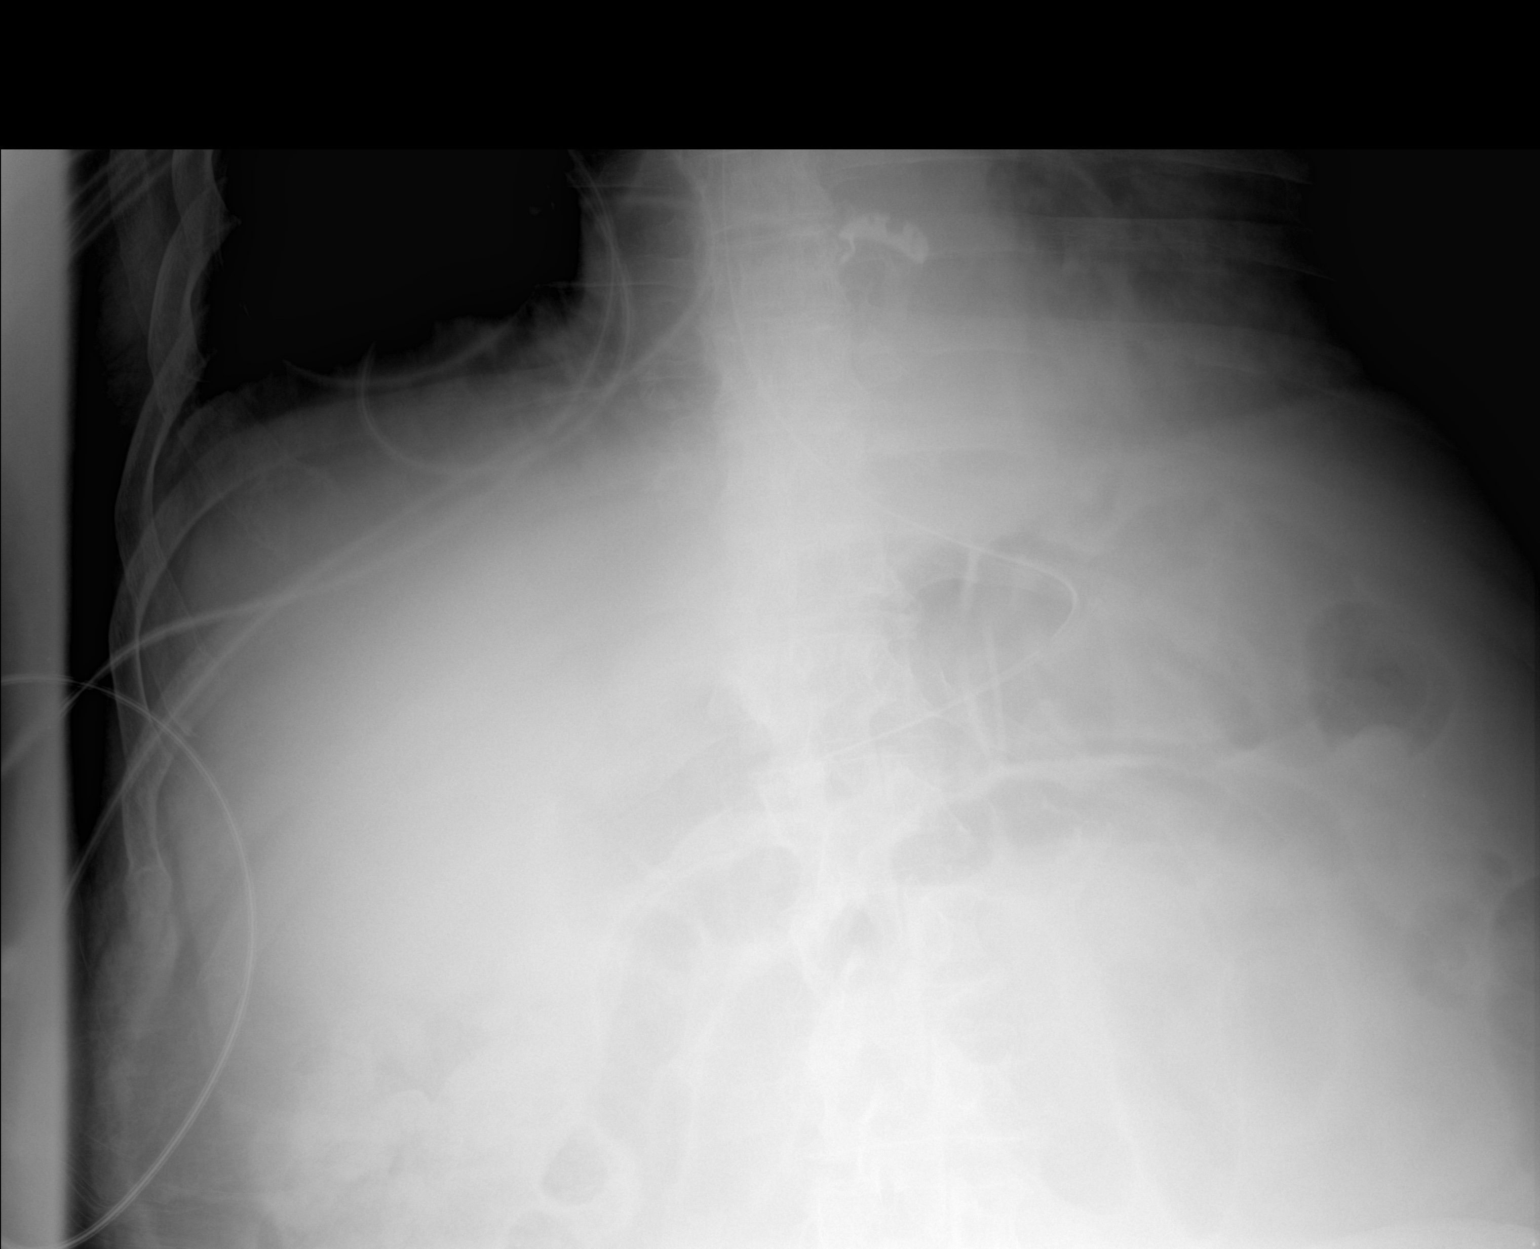

[2 of 2 positions shown; findings below may reference images not displayed]

FINDINGS: An enteric tube is partially visualized with tip over the upper
lumbar spine likely in the distal stomach. Nondilated air-filled
loops of small and large bowel in the visualized upper abdomen
IMPRESSION: Enteric tube with tip likely in the distal stomach.
